# Patient Record
Sex: Female | Born: 1950 | Race: White | Hispanic: No | Marital: Married | Smoking: Former smoker
Health system: Southern US, Community
[De-identification: ages and names within clinical notes are randomized; demographics above are authoritative.]

## PROBLEM LIST (undated history)

## (undated) DIAGNOSIS — E785 Hyperlipidemia, unspecified: Secondary | ICD-10-CM

## (undated) DIAGNOSIS — R112 Nausea with vomiting, unspecified: Secondary | ICD-10-CM

## (undated) DIAGNOSIS — M1990 Unspecified osteoarthritis, unspecified site: Secondary | ICD-10-CM

## (undated) DIAGNOSIS — IMO0002 Reserved for concepts with insufficient information to code with codable children: Secondary | ICD-10-CM

## (undated) DIAGNOSIS — H9319 Tinnitus, unspecified ear: Secondary | ICD-10-CM

## (undated) DIAGNOSIS — D219 Benign neoplasm of connective and other soft tissue, unspecified: Secondary | ICD-10-CM

## (undated) DIAGNOSIS — Z9889 Other specified postprocedural states: Secondary | ICD-10-CM

## (undated) HISTORY — PX: LAPAROSCOPY FOR ECTOPIC PREGNANCY: SUR765

## (undated) HISTORY — DX: Tinnitus, unspecified ear: H93.19

## (undated) HISTORY — PX: TONSILLECTOMY AND ADENOIDECTOMY: SUR1326

## (undated) HISTORY — DX: Benign neoplasm of connective and other soft tissue, unspecified: D21.9

## (undated) HISTORY — DX: Reserved for concepts with insufficient information to code with codable children: IMO0002

## (undated) HISTORY — DX: Unspecified osteoarthritis, unspecified site: M19.90

## (undated) HISTORY — PX: COLONOSCOPY: SHX174

## (undated) HISTORY — DX: Hyperlipidemia, unspecified: E78.5

## (undated) HISTORY — PX: ABDOMINAL HYSTERECTOMY: SHX81

---

## 1971-07-06 HISTORY — PX: WISDOM TOOTH EXTRACTION: SHX21

## 1984-07-05 HISTORY — PX: LAPAROTOMY: SHX154

## 2002-07-05 HISTORY — PX: LITHOTRIPSY: SUR834

## 2003-05-09 ENCOUNTER — Ambulatory Visit (HOSPITAL_COMMUNITY): Admission: RE | Admit: 2003-05-09 | Discharge: 2003-05-09 | Payer: Self-pay | Admitting: Urology

## 2004-04-14 ENCOUNTER — Other Ambulatory Visit: Admission: RE | Admit: 2004-04-14 | Discharge: 2004-04-14 | Payer: Self-pay | Admitting: Obstetrics and Gynecology

## 2004-05-08 ENCOUNTER — Ambulatory Visit: Payer: Self-pay | Admitting: Internal Medicine

## 2005-11-23 ENCOUNTER — Other Ambulatory Visit: Admission: RE | Admit: 2005-11-23 | Discharge: 2005-11-23 | Payer: Self-pay | Admitting: Obstetrics and Gynecology

## 2006-12-15 ENCOUNTER — Other Ambulatory Visit: Admission: RE | Admit: 2006-12-15 | Discharge: 2006-12-15 | Payer: Self-pay | Admitting: Obstetrics and Gynecology

## 2007-12-18 ENCOUNTER — Other Ambulatory Visit: Admission: RE | Admit: 2007-12-18 | Discharge: 2007-12-18 | Payer: Self-pay | Admitting: Obstetrics & Gynecology

## 2008-07-05 DIAGNOSIS — IMO0002 Reserved for concepts with insufficient information to code with codable children: Secondary | ICD-10-CM

## 2008-07-05 HISTORY — DX: Reserved for concepts with insufficient information to code with codable children: IMO0002

## 2008-08-15 ENCOUNTER — Encounter: Admission: RE | Admit: 2008-08-15 | Discharge: 2008-08-15 | Payer: Self-pay | Admitting: Family Medicine

## 2008-09-19 ENCOUNTER — Encounter: Admission: RE | Admit: 2008-09-19 | Discharge: 2008-09-19 | Payer: Self-pay | Admitting: Neurosurgery

## 2008-10-07 ENCOUNTER — Encounter: Admission: RE | Admit: 2008-10-07 | Discharge: 2008-10-07 | Payer: Self-pay | Admitting: Neurosurgery

## 2012-06-21 ENCOUNTER — Telehealth: Payer: Self-pay | Admitting: Internal Medicine

## 2012-06-21 NOTE — Telephone Encounter (Signed)
Patient is calling because her younger sister has colon cancer. Her PCP wanted her to check with Dr. Juanda Chance to see if she needs to have a colonoscopy earlier than previously scheduled. Last colonoscopy 05/08/2004- Normal examination. Chart to MD desk for review. Please, advise.

## 2012-06-21 NOTE — Telephone Encounter (Signed)
Reviewed, last colon 05/08/2004.Mild diverticulosis. If there is a new hx of colon cancer in her sister then is ought to have a colonoscopy ASAP and stay on  5 year recall as a high risk patient. She may be a direct colon unless she wants to talk.

## 2012-06-22 NOTE — Telephone Encounter (Signed)
Left a message for patient to call me. 

## 2012-06-23 NOTE — Telephone Encounter (Signed)
Left a message for patient to call me. 

## 2012-06-26 ENCOUNTER — Encounter: Payer: Self-pay | Admitting: Internal Medicine

## 2012-06-26 NOTE — Telephone Encounter (Signed)
Patient given recommendations by Dr. Juanda Chance. She will check her husband's schedule and call back to schedule her direct colonoscopy.

## 2012-07-28 ENCOUNTER — Telehealth: Payer: Self-pay | Admitting: *Deleted

## 2012-07-28 NOTE — Telephone Encounter (Signed)
Pt. rescheduled previsit til 07/31/12 0800

## 2012-07-31 ENCOUNTER — Ambulatory Visit (AMBULATORY_SURGERY_CENTER): Payer: Federal, State, Local not specified - PPO | Admitting: *Deleted

## 2012-07-31 ENCOUNTER — Encounter: Payer: Self-pay | Admitting: Internal Medicine

## 2012-07-31 VITALS — Ht 67.0 in | Wt 205.0 lb

## 2012-07-31 DIAGNOSIS — Z1211 Encounter for screening for malignant neoplasm of colon: Secondary | ICD-10-CM

## 2012-07-31 MED ORDER — MOVIPREP 100 G PO SOLR: ORAL | Status: DC

## 2012-08-11 ENCOUNTER — Encounter: Payer: Self-pay | Admitting: Internal Medicine

## 2012-08-11 ENCOUNTER — Ambulatory Visit (AMBULATORY_SURGERY_CENTER): Payer: Federal, State, Local not specified - PPO | Admitting: Internal Medicine

## 2012-08-11 VITALS — BP 145/79 | HR 61 | Temp 96.9°F | Resp 20 | Ht 67.0 in | Wt 207.0 lb

## 2012-08-11 DIAGNOSIS — Z1211 Encounter for screening for malignant neoplasm of colon: Secondary | ICD-10-CM

## 2012-08-11 DIAGNOSIS — Z800 Family history of malignant neoplasm of digestive organs: Secondary | ICD-10-CM

## 2012-08-11 MED ORDER — SODIUM CHLORIDE 0.9 % IV SOLN: 500.0000 mL | INTRAVENOUS | Status: DC

## 2012-08-11 NOTE — Progress Notes (Signed)
Patient did not experience any of the following events: a burn prior to discharge; a fall within the facility; wrong site/side/patient/procedure/implant event; or a hospital transfer or hospital admission upon discharge from the facility. (G8907) Patient did not have preoperative order for IV antibiotic SSI prophylaxis. (G8918)  

## 2012-08-11 NOTE — Progress Notes (Signed)
Pt stable to PACU 

## 2012-08-11 NOTE — Op Note (Signed)
[facility] [address] [place], [address]   COLONOSCOPY PROCEDURE REPORT  PATIENT: April Sawyer, April Sawyer  MR#: [id number] BIRTHDATE: [birth year] , 61  yrs. old GENDER: Female ENDOSCOPIST: Hart Carwin, MD REFERRED BY:  Elby Showers, M.D. PROCEDURE DATE:  08/11/2012 PROCEDURE:   Colonoscopy, screening ASA CLASS:   Class II INDICATIONS:Patient's immediate family history of colon cancer and last colon 2005, sister with colon cancer. MEDICATIONS: MAC sedation, administered by CRNA and propofol (Diprivan) 250mg  IV  DESCRIPTION OF PROCEDURE:   After the risks and benefits and of the procedure were explained, informed consent was obtained.  A digital rectal exam revealed no abnormalities of the rectum.    The LB [id number]  endoscope was introduced through the anus and advanced to the cecum, which was identified by both the appendix and ileocecal valve .  The quality of the prep was excellent, using MoviPrep .  The instrument was then slowly withdrawn as the colon was fully examined.     COLON FINDINGS: Mild diverticulosis was noted.     Retroflexed views revealed no abnormalities.     The scope was then withdrawn from the patient and the procedure completed.  COMPLICATIONS: There were no complications. ENDOSCOPIC IMPRESSION: Mild diverticulosis was noted  RECOMMENDATIONS: High fiber diet   REPEAT EXAM: In 5 year(s)  for Colonoscopy.  cc:  _______________________________ eSignedHart Carwin, MD 08/11/2012 1:56 PM

## 2012-08-11 NOTE — Patient Instructions (Addendum)

## 2012-08-14 ENCOUNTER — Telehealth: Payer: Self-pay | Admitting: *Deleted

## 2012-08-14 NOTE — Telephone Encounter (Signed)
  Follow up Call-  Call back number 08/11/2012  Post procedure Call Back phone  # [phone]  Permission to leave phone message Yes     Patient questions:  Do you have a fever, pain , or abdominal swelling? no Pain Score  0 *  Have you tolerated food without any problems? yes  Have you been able to return to your normal activities? yes  Do you have any questions about your discharge instructions: Diet   no Medications  no Follow up visit  no  Do you have questions or concerns about your Care? no  Actions: * If pain score is 4 or above: No action needed, pain <4.

## 2012-08-23 NOTE — Addendum Note (Signed)
Addended by: Maple Hudson on: 08/23/2012 09:54 AM   Modules accepted: Level of Service

## 2012-08-28 ENCOUNTER — Other Ambulatory Visit: Payer: Self-pay | Admitting: Neurosurgery

## 2012-08-28 DIAGNOSIS — IMO0002 Reserved for concepts with insufficient information to code with codable children: Secondary | ICD-10-CM

## 2012-09-05 ENCOUNTER — Other Ambulatory Visit: Payer: Self-pay | Admitting: Neurosurgery

## 2012-09-05 ENCOUNTER — Ambulatory Visit
Admission: RE | Admit: 2012-09-05 | Discharge: 2012-09-05 | Disposition: A | Discharge status: Home / Self Care | Payer: Federal, State, Local not specified - PPO | Source: Ambulatory Visit | Attending: Neurosurgery | Admitting: Neurosurgery

## 2012-09-05 DIAGNOSIS — M5126 Other intervertebral disc displacement, lumbar region: Secondary | ICD-10-CM

## 2012-09-05 DIAGNOSIS — M48061 Spinal stenosis, lumbar region without neurogenic claudication: Secondary | ICD-10-CM

## 2012-09-05 DIAGNOSIS — IMO0002 Reserved for concepts with insufficient information to code with codable children: Secondary | ICD-10-CM

## 2012-10-12 ENCOUNTER — Other Ambulatory Visit: Payer: Self-pay | Admitting: Neurosurgery

## 2012-10-12 DIAGNOSIS — M549 Dorsalgia, unspecified: Secondary | ICD-10-CM

## 2012-10-16 ENCOUNTER — Ambulatory Visit
Admission: RE | Admit: 2012-10-16 | Discharge: 2012-10-16 | Disposition: A | Discharge status: Home / Self Care | Payer: Federal, State, Local not specified - PPO | Source: Ambulatory Visit | Attending: Neurosurgery | Admitting: Neurosurgery

## 2012-10-16 DIAGNOSIS — M549 Dorsalgia, unspecified: Secondary | ICD-10-CM

## 2012-10-16 MED ORDER — METHYLPREDNISOLONE ACETATE 40 MG/ML INJ SUSP (RADIOLOG
120.0000 mg | Freq: Once | INTRAMUSCULAR | Status: AC
  Administered 2012-10-16: 120 mg via EPIDURAL

## 2012-10-16 MED ORDER — IOHEXOL 180 MG/ML  SOLN
1.0000 mL | Freq: Once | INTRAMUSCULAR | Status: AC | PRN
  Administered 2012-10-16: 1 mL via EPIDURAL

## 2013-02-02 ENCOUNTER — Encounter: Payer: Self-pay | Admitting: Obstetrics and Gynecology

## 2013-02-02 ENCOUNTER — Ambulatory Visit: Payer: Self-pay | Admitting: Obstetrics and Gynecology

## 2013-02-02 DIAGNOSIS — Z01419 Encounter for gynecological examination (general) (routine) without abnormal findings: Secondary | ICD-10-CM

## 2013-08-27 ENCOUNTER — Ambulatory Visit (INDEPENDENT_AMBULATORY_CARE_PROVIDER_SITE_OTHER): Payer: Federal, State, Local not specified - PPO | Admitting: Obstetrics & Gynecology

## 2013-08-27 ENCOUNTER — Encounter: Payer: Self-pay | Admitting: Obstetrics & Gynecology

## 2013-08-27 VITALS — BP 140/78 | HR 64 | Resp 16 | Ht 65.5 in | Wt 206.5 lb

## 2013-08-27 DIAGNOSIS — Z8041 Family history of malignant neoplasm of ovary: Secondary | ICD-10-CM

## 2013-08-27 DIAGNOSIS — Z124 Encounter for screening for malignant neoplasm of cervix: Secondary | ICD-10-CM

## 2013-08-27 DIAGNOSIS — Z0000 Encounter for general adult medical examination without abnormal findings: Secondary | ICD-10-CM

## 2013-08-27 DIAGNOSIS — Z01419 Encounter for gynecological examination (general) (routine) without abnormal findings: Secondary | ICD-10-CM

## 2013-08-27 DIAGNOSIS — D219 Benign neoplasm of connective and other soft tissue, unspecified: Secondary | ICD-10-CM

## 2013-08-27 DIAGNOSIS — D259 Leiomyoma of uterus, unspecified: Secondary | ICD-10-CM

## 2013-08-27 DIAGNOSIS — R899 Unspecified abnormal finding in specimens from other organs, systems and tissues: Secondary | ICD-10-CM

## 2013-08-27 DIAGNOSIS — R6889 Other general symptoms and signs: Secondary | ICD-10-CM

## 2013-08-27 LAB — POCT URINALYSIS DIPSTICK
Bilirubin, UA: NEGATIVE
Blood, UA: NEGATIVE
GLUCOSE UA: NEGATIVE
Ketones, UA: NEGATIVE
NITRITE UA: NEGATIVE
Protein, UA: NEGATIVE
UROBILINOGEN UA: NEGATIVE
pH, UA: 5

## 2013-08-27 LAB — HEMOGLOBIN A1C
Hgb A1c MFr Bld: 5.8 % — ABNORMAL HIGH (ref <5.7)
Mean Plasma Glucose: 120 mg/dL — ABNORMAL HIGH (ref <117)

## 2013-08-27 MED ORDER — SOLIFENACIN SUCCINATE 5 MG PO TABS: 5.0000 mg | ORAL_TABLET | Freq: Every day | ORAL | Status: DC

## 2013-08-27 NOTE — Progress Notes (Signed)
63 y.o. [id number] MarriedCaucasianF here for annual exam.  No vaginal bleeding.  Notices that she is getting up a lot more at night to urinate.  Feels urgency as well.  Tried oxytrol OTC patches and but she didn't like the patch stickiness.    Patient's last menstrual period was 12/03/2008.          Sexually active: yes  The current method of family planning is none.    Exercising: yes  water aerobics Smoker:  Former smoker in [place] Maintenance: Pap:  01/26/11 WNL History of abnormal Pap:  no MMG:  06/04/13-normal Colonoscopy:  08/11/12 repeat in 5 years BMD:   4/10 normal TDaP:  8/07 Screening Labs: work (copies scanned into EP[employer]), Hb today: 14.0, Urine today: WBC-2+   reports that she has quit smoking. She has never used smokeless tobacco. She reports that she drinks about 3.0 ounces of alcohol per week. She reports that she does not use illicit drugs.  Past Medical History  Diagnosis Date  . Arthritis   . Hyperlipidemia   . Fibroid 8/08    11x6x5 cm  . 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 Hyperlipidemia   . Tinnitus   . Bulging disc 2010    L3-L5    Past Surgical History  Procedure Laterality Date  . Ectopic pregnancy surgery  1986  . Laparoscopy for ectopic pregnancy  1986-1992    x three  . Lithotripsy  2004  . Salpingectomy Left 1986    for ectopic; 2 ectopics R tube  . Tonsillectomy and adenoidectomy      Current Outpatient Prescriptions  Medication Sig Dispense Refill  . aspirin 81 MG tablet Take 81 mg by mouth daily.      Marland Kitchen atorvastatin (LIPITOR) 10 MG tablet Take 10 mg by mouth daily.      . Calcium Carbonate-Vitamin D (CALCIUM + D PO) Take by mouth daily.      . IBUPROFEN PO Take by mouth as needed.      . Multiple Vitamins-Minerals (MULTIVITAMIN WITH MINERALS) tablet Take 1 tablet by mouth daily.      . psyllium (REGULOID) 0.52 G capsule Take 4 capsules by mouth daily.       No current facility-administered medications for this visit.    Family History   Problem Relation Age of Onset  . Colon cancer Sister   . Breast cancer Sister   . Ovarian cancer Mother 46    ROS:  Pertinent items are noted in HPI.  Otherwise, a comprehensive ROS was negative.  Exam:   BP 140/78  Pulse 64  Resp 16  Ht 5' 5.5" (1.664 m)  Wt 206 lb 8 oz (93.668 kg)  BMI 33.83 kg/m2  LMP 12/03/2008  Weight change: -2lb  Height: 5' 5.5" (166.4 cm)  Ht Readings from Last 3 Encounters:  08/27/13 5' 5.5" (1.664 m)  08/11/12 5\' 7"  (1.702 m)  07/31/12 5\' 7"  (1.702 m)    General appearance: alert, cooperative and appears stated age Head: Normocephalic, without obvious abnormality, atraumatic Neck: no adenopathy, supple, symmetrical, trachea midline and thyroid normal to inspection and palpation Lungs: clear to auscultation bilaterally Breasts: normal appearance, no masses or tenderness Heart: regular rate and rhythm Abdomen: soft, non-tender; bowel sounds normal; no masses,  no organomegaly Extremities: extremities normal, atraumatic, no cyanosis or edema Skin: Skin color, texture, turgor normal. No rashes or lesions Lymph nodes: Cervical, supraclavicular, and axillary nodes normal. No abnormal inguinal nodes palpated Neurologic: Grossly  normal   Pelvic: External genitalia:  no lesions              Urethra:  normal appearing urethra with no masses, tenderness or lesions              Bartholins and Skenes: normal                 Vagina: normal appearing vagina with normal color and discharge, no lesions              Cervix: no lesions              Pap taken: yes Bimanual Exam:  Uterus:  normal size, contour, position, consistency, mobility, non-tender              Adnexa: normal adnexa and no mass, fullness, tenderness               Rectovaginal: Confirms               Anus:  normal sphincter tone, no lesions  A:  Well Woman with normal exam PMP, no HRT H/O uterine fibroid OAB Mildly elevated glucose on screening labs at work Family hx ovarian cancer in  mother Family hx of breast cancer and colon cancer in sister  P:   Mammogram yearly.  D/W 3D MMG pap smear with HR HPV today Trial of vesicare 5mg  daily.  Rx to pharmacy.  Dry eyes, dry mouth, constipation side effects discussed. Ca-125 today.  PUS Aug--2 years from last one. HbA1C pending. BMD with MMG in 12/15.  Order sent to [facility]. return annually or prn  An After Visit Summary was printed and given to the patient.

## 2013-08-27 NOTE — Patient Instructions (Signed)

## 2013-08-28 LAB — HEMOGLOBIN, FINGERSTICK: HEMOGLOBIN, FINGERSTICK: 14 g/dL (ref 12.0–16.0)

## 2013-08-28 LAB — CA 125: CA 125: 5.9 U/mL (ref 0.0–30.2)

## 2013-08-29 ENCOUNTER — Telehealth: Payer: Self-pay | Admitting: Obstetrics & Gynecology

## 2013-08-29 LAB — IPS PAP TEST WITH HPV

## 2013-08-29 NOTE — Telephone Encounter (Signed)
Left message for patient to call back. Need to go over insurance info and schedule pus.

## 2013-09-05 NOTE — Telephone Encounter (Signed)
Voicemail confirmed patient identity. Left message for patient to call back. Need to go over insurance info and schedule pus. Advised that PR for PUS is a $40 copay.

## 2013-09-14 NOTE — Telephone Encounter (Signed)
Spoke with patient. Advised of $40 copay for pus. Scheduled pus. Advised of cancellation policy and cancellation fee. Patient agreeable. Edd Fabian

## 2013-09-27 ENCOUNTER — Ambulatory Visit (INDEPENDENT_AMBULATORY_CARE_PROVIDER_SITE_OTHER): Payer: Federal, State, Local not specified - PPO

## 2013-09-27 ENCOUNTER — Ambulatory Visit (INDEPENDENT_AMBULATORY_CARE_PROVIDER_SITE_OTHER): Payer: Federal, State, Local not specified - PPO | Admitting: Obstetrics & Gynecology

## 2013-09-27 ENCOUNTER — Ambulatory Visit: Payer: Federal, State, Local not specified - PPO

## 2013-09-27 ENCOUNTER — Other Ambulatory Visit: Payer: Self-pay | Admitting: *Deleted

## 2013-09-27 ENCOUNTER — Other Ambulatory Visit: Payer: Self-pay | Admitting: Obstetrics & Gynecology

## 2013-09-27 VITALS — BP 134/72 | Ht 65.5 in | Wt 209.0 lb

## 2013-09-27 DIAGNOSIS — D219 Benign neoplasm of connective and other soft tissue, unspecified: Secondary | ICD-10-CM

## 2013-09-27 DIAGNOSIS — D259 Leiomyoma of uterus, unspecified: Secondary | ICD-10-CM

## 2013-09-27 DIAGNOSIS — Z8041 Family history of malignant neoplasm of ovary: Secondary | ICD-10-CM

## 2013-09-27 DIAGNOSIS — R9389 Abnormal findings on diagnostic imaging of other specified body structures: Secondary | ICD-10-CM

## 2013-09-27 NOTE — Progress Notes (Signed)
63 y.o.Marriedfemale here for a pelvic ultrasound due to uterine fibroids and family hx of ovarian cancer.  Pt has had multiple ultrasounds over the last years and endometrial biopsies due do thickened appearing endometrium.  She has not had any bleeding since going into menopause several years ago.  Wants to discuss hysterectomy today, if possible.  Retiring next year and just ready to be done.  Patient's last menstrual period was 12/03/2008.  Sexually active:  yes  Contraception: no method  Technique:  Both transabdominal and transvaginal ultrasound examinations of the pelvis were performed. Transabdominal technique was performed for global imaging of the pelvis including uterus,  ovaries, adnexal regions, and pelvic cul-de-sac.  It was necessary to proceed with endovaginal exam following the abdominal ultrasound transabdominal exam to visualize the endometrium and adnexa.  Color and duplex Doppler ultrasound was utilized to evaluate blood flow to the ovaries.   FINDINGS: UTERUS: 7.1 x 6.6 x 4.5cm with 4.4cm fibroid (slightly increased in size from 3.8cm at last u/s) EMS: 7.8 - 9.20mm, bright in appearance on u/s, posterior wall appears thickened ADNEXA:   Left ovary 2.3 x 1.5 x 1.2cm   Right ovary 1.5 x 1.3 x 0.8cm CUL DE SAC:  No free fluid in PCDS  SHSG:  After obtaining appropriate verbal consent from patient, the cervix was visualized using a speculum, and prepped with betadine.  A tenaculum  was applied to the cervix.  Dilation of the cervix was not necessary. The catheter was passed into the uterus and sterile saline introduced, with the following findings: posterior filling defect 71mm  Images reviewed with patient and findings discussed.  Now with a possible endometrial polyp and need to consider hysteroscopy, she is really ready to be done and proceed with hysterectomy.  Daughter graduating from college in May so will want to do this later.  I am fine with that.  H/x of multiple ectopic  pregnancies but never had one rupture.  Has had several laparoscopies that went well, per patient.  Procedure discussed, risks and benefits reviewed.  Timing, hospital stay all discussed.  Pt ready to schedule  Assessment:  Endometrial thickening, uterine fibroids, family hx of ovarian cancer in her mother Plan: Plan robotic assisted TLH/BSO.  Also plan pre-op endometrial biopsy.  ~30 minutes spent with patient >50% of time was in face to face discussion of above.

## 2013-09-28 ENCOUNTER — Encounter: Payer: Self-pay | Admitting: Obstetrics & Gynecology

## 2013-09-28 DIAGNOSIS — D219 Benign neoplasm of connective and other soft tissue, unspecified: Secondary | ICD-10-CM | POA: Insufficient documentation

## 2013-09-28 DIAGNOSIS — R9389 Abnormal findings on diagnostic imaging of other specified body structures: Secondary | ICD-10-CM | POA: Insufficient documentation

## 2013-09-28 DIAGNOSIS — Z8041 Family history of malignant neoplasm of ovary: Secondary | ICD-10-CM | POA: Insufficient documentation

## 2013-09-28 NOTE — Patient Instructions (Signed)
Gay Filler will call you with information regarding your surgery.

## 2013-10-02 ENCOUNTER — Telehealth: Payer: Self-pay | Admitting: Obstetrics & Gynecology

## 2013-10-02 NOTE — Telephone Encounter (Signed)
Left message for patient to call back. Need to go over surgery benefits. °

## 2013-10-09 NOTE — Telephone Encounter (Signed)
Spoke with patient, advised that per insurance quote received she will be responsible for a $200 copay for surgeons fees. Advised that payment is due 2 weeks prior to surgery date. Advised that nurse would contact her to schedule surgery.

## 2013-10-18 ENCOUNTER — Telehealth: Payer: Self-pay | Admitting: Orthopedic Surgery

## 2013-10-18 NOTE — Telephone Encounter (Signed)
Spoke with pt about pre-op instructions, and scheduling pre-op and postop appts. Mailed instructions to pt. Pt agreeable.

## 2013-10-26 ENCOUNTER — Other Ambulatory Visit: Payer: Self-pay | Admitting: *Deleted

## 2013-10-26 DIAGNOSIS — R9389 Abnormal findings on diagnostic imaging of other specified body structures: Secondary | ICD-10-CM

## 2013-10-30 ENCOUNTER — Telehealth: Payer: Self-pay | Admitting: Obstetrics & Gynecology

## 2013-10-30 NOTE — Telephone Encounter (Signed)
Left message for patient to call back. Need to inform patient of $150 copay for Endo Bx scheduled 06.22.2015.

## 2013-12-21 ENCOUNTER — Telehealth: Payer: Self-pay | Admitting: Obstetrics and Gynecology

## 2013-12-21 NOTE — Telephone Encounter (Signed)
December 21, 2013   Dear April Sawyer,   Your surgery is scheduled for January 07, 2014.  Upon requesting authorization for your surgery, your insurance company has informed us that they will cover 100% of the charges after a $200 copay, and you will be responsible to pay approximately $200.  It is our office policy that this amount is paid in full two weeks prior to your surgery. Your payment is due on December 24, 2013.  If there is a balance due after your insurance company pays their portion, we will send you a bill.  If there is a refund due to you, we will send you a check within one month.  Payment may be made by cash, check, Visa, Nurse, learning disability. Payment can be made in the office or over the telephone.  If payment is not made two weeks prior to your surgery, we will have to reschedule your surgery.  The above fee includes only our fee for the surgery and does not include charges you may have from the facility, anesthesia or pathology.  If you have any questions, please call us at [phone].

## 2013-12-24 ENCOUNTER — Ambulatory Visit (INDEPENDENT_AMBULATORY_CARE_PROVIDER_SITE_OTHER): Payer: Federal, State, Local not specified - PPO | Admitting: Obstetrics & Gynecology

## 2013-12-24 ENCOUNTER — Encounter: Payer: Self-pay | Admitting: Obstetrics & Gynecology

## 2013-12-24 VITALS — BP 130/78 | HR 78 | Resp 20 | Ht 65.5 in | Wt 212.0 lb

## 2013-12-24 DIAGNOSIS — D251 Intramural leiomyoma of uterus: Secondary | ICD-10-CM

## 2013-12-24 DIAGNOSIS — Z8041 Family history of malignant neoplasm of ovary: Secondary | ICD-10-CM

## 2013-12-24 DIAGNOSIS — N840 Polyp of corpus uteri: Secondary | ICD-10-CM

## 2013-12-24 DIAGNOSIS — R9389 Abnormal findings on diagnostic imaging of other specified body structures: Secondary | ICD-10-CM

## 2013-12-24 MED ORDER — MORPHINE SULFATE 15 MG PO TABS: 15.0000 mg | ORAL_TABLET | ORAL | Status: DC | PRN

## 2013-12-24 MED ORDER — IBUPROFEN 800 MG PO TABS: 800.0000 mg | ORAL_TABLET | Freq: Three times a day (TID) | ORAL | Status: DC | PRN

## 2013-12-24 NOTE — Progress Notes (Signed)
Patient ID: April Sawyer, female   DOB: Aug 20, 1950, 63 y.o.   MRN: [id number]  62 y.o. [id number] MarriedCaucasian female here for discussion of upcoming procedure.  Robotic TLH/BSO planned due to uterine fibroids, increasing in size.  Pre-op evaluation thus far has included ultrasound done 09/27/13 with uterus measuring 7 x 6.6 x 4.5 with 4.4cm fibroid (increased from 3.8cm in size).  Endometrium was thickened measuring between 7-6mm.  SHGM was performed showing a 1cm endometrial polyp.  She has had no bleeding.    Pt's hx is complicated by four prior ectopic pregnancies.  One of these was treated with laparotomy and three with laparoscopy.  Ultimately one of her tubes was removed.  She is unsure which one.  She was able to have 2 vaginal deliveries.    Procedure discussed with patient.  Hospital stay, recovery and pain management all discussed.  Risks discussed including but not limited to bleeding, 1% risk of receiving a  transfusion, infection, 3-4% risk of bowel/bladder/ureteral/vascular injury discussed as well as possible need for additional surgery if injury does occur discussed.  DVT/PE and rare risk of death discussed.  My actual complications with prior surgeries discussed.  Vaginal cuff dehiscence discussed.  Hernia formation discussed.  Positioning and incision locations discussed.  Patient aware if pathology abnormal she may need additional treatment.  All questions answered.    Ob Hx:   Patient's last menstrual period was 12/03/2008.          Sexually active: yes Birth control: PMP state Last pap: Last MMG: Tobacco:  Past Surgical History  Procedure Laterality Date  . Laparoscopy for ectopic pregnancy  1986-1992    x three, salpingectomy  . Lithotripsy  2004  . Tonsillectomy and adenoidectomy      Past Medical History  Diagnosis Date  . Arthritis   . Hyperlipidemia   . Fibroid      8/12-3.8cm  . 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 Hyperlipidemia   . Tinnitus   . Bulging disc  2010    L3-L5    Allergies: Influenza vaccines; Percodan; and Thimerosal  Current Outpatient Prescriptions  Medication Sig Dispense Refill  . atorvastatin (LIPITOR) 10 MG tablet Take 10 mg by mouth daily.      . Multiple Vitamins-Minerals (MULTIVITAMIN WITH MINERALS) tablet Take 1 tablet by mouth daily.      . psyllium (REGULOID) 0.52 G capsule Take 4 capsules by mouth daily.      . solifenacin (VESICARE) 5 MG tablet Take 1 tablet (5 mg total) by mouth daily. One po qd  30 tablet  12  . aspirin 81 MG tablet Take 81 mg by mouth daily.      . Calcium Carbonate-Vitamin D (CALCIUM + D PO) Take by mouth daily.      . IBUPROFEN PO Take by mouth as needed.       No current facility-administered medications for this visit.    ROS: A comprehensive review of systems was negative.  Exam:    BP 130/78  Pulse 78  Resp 20  Ht 5' 5.5" (1.664 m)  Wt 212 lb (96.163 kg)  BMI 34.73 kg/m2  LMP 12/03/2008  General appearance: alert and cooperative Head: Normocephalic, without obvious abnormality, atraumatic Neck: no adenopathy, supple, symmetrical, trachea midline and thyroid not enlarged, symmetric, no tenderness/mass/nodules Lungs: clear to auscultation bilaterally Heart: regular rate and rhythm, S1, S2 normal, no murmur, click, rub or gallop Abdomen: soft, non-tender; bowel sounds normal; no masses,  no organomegaly Extremities: extremities normal, atraumatic, no cyanosis or edema Skin: Skin color, texture, turgor normal. No rashes or lesions Lymph nodes: Cervical, supraclavicular, and axillary nodes normal. no inguinal nodes palpated Neurologic: Grossly normal  Pelvic: External genitalia:  no lesions              Urethra: normal appearing urethra with no masses, tenderness or lesions              Bartholins and Skenes: normal                 Vagina: normal appearing vagina with normal color and discharge, no lesions, atrophic              Cervix: normal appearance              Pap  taken: no        Bimanual Exam:  Uterus:  uterus is normal size, shape, consistency and nontender                                      Adnexa:    normal adnexa in size, nontender and no masses                                      Rectovaginal: Deferred                                      Anus:  normal sphincter tone, no lesions  Endometrial biopsy recommended.  Discussed with patient.  Verbal and written consent obtained.    Procedure:  Speculum placed.  Cervix visualized and cleansed with betadine prep.  A single toothed tenaculum was applied to the anterior lip of the cervix.  Endometrial pipelle was advanced through the cervix into the endometrial cavity without difficulty.  Pipelle passed to 7.5cm.  Suction applied and pipelle removed with good tissue sample obtained.  Two passes were performed.  Tenculum removed.  No bleeding noted.  Patient tolerated procedure well.  A: Uterine Fibroids, probable endometrial polyp, family hx of ovarian cancer (mother age 39)  P:  Robotic assisted TLH/BSO/cystoscopy planned Rx for Motrin and MSIR 15mg  every 4 hrs prn given. Medications/Vitamins reviewed.  Pt knows needs to stop ASA 10 days before surgery. Hysterectomy brochure given for pre and post op instructions. Endometrial biopsy result is pending.  ~30 minutes spent with patient >50% of time was in face to face discussion of above.

## 2013-12-24 NOTE — Patient Instructions (Signed)
Hysterectomy brochure given.

## 2014-01-02 ENCOUNTER — Encounter (HOSPITAL_COMMUNITY)
Admission: RE | Admit: 2014-01-02 | Discharge: 2014-01-02 | Disposition: A | Discharge status: Home / Self Care | Payer: Federal, State, Local not specified - PPO | Source: Ambulatory Visit | Attending: Obstetrics & Gynecology | Admitting: Obstetrics & Gynecology

## 2014-01-02 ENCOUNTER — Encounter (HOSPITAL_COMMUNITY): Payer: Self-pay

## 2014-01-02 DIAGNOSIS — Z01812 Encounter for preprocedural laboratory examination: Secondary | ICD-10-CM | POA: Insufficient documentation

## 2014-01-02 HISTORY — DX: Other specified postprocedural states: Z98.890

## 2014-01-02 HISTORY — DX: Nausea with vomiting, unspecified: R11.2

## 2014-01-02 LAB — CBC
HEMATOCRIT: 42.4 % (ref 36.0–46.0)
Hemoglobin: 14.1 g/dL (ref 12.0–15.0)
MCH: 32.6 pg (ref 26.0–34.0)
MCHC: 33.3 g/dL (ref 30.0–36.0)
MCV: 97.9 fL (ref 78.0–100.0)
Platelets: 193 10*3/uL (ref 150–400)
RBC: 4.33 MIL/uL (ref 3.87–5.11)
RDW: 12.6 % (ref 11.5–15.5)
WBC: 4.7 10*3/uL (ref 4.0–10.5)

## 2014-01-02 NOTE — Patient Instructions (Addendum)
   Your procedure is scheduled on:  Monday July 6  Enter through the [facility] at: 6 AM Pick up the phone at the desk and dial [phone] and inform us of your arrival.  Please call this number if you have any problems the morning of surgery: [phone]  Remember: Do not eat or drink after midnight: Sunday Take these medicines the morning of surgery with a SIP OF WATER:  None  Do not wear jewelry, make-up, or FINGER nail polish No metal in your hair or on your body. Do not wear lotions, powders, perfumes.  You may wear deodorant.  Do not bring valuables to the hospital. Contacts, dentures or bridgework may not be worn into surgery.  Leave suitcase in the car. After Surgery it may be brought to your room. For patients being admitted to the hospital, checkout time is 11:00am the day of discharge.  Home with husband Richardson Landry cell [phone].

## 2014-01-06 MED ORDER — DEXTROSE 5 % IV SOLN
2.0000 g | INTRAVENOUS | Status: AC
  Administered 2014-01-07: 2 g via INTRAVENOUS
  Filled 2014-01-06: qty 2

## 2014-01-07 ENCOUNTER — Ambulatory Visit (HOSPITAL_COMMUNITY): Payer: Federal, State, Local not specified - PPO | Admitting: Anesthesiology

## 2014-01-07 ENCOUNTER — Encounter (HOSPITAL_COMMUNITY): Payer: Self-pay | Admitting: Anesthesiology

## 2014-01-07 ENCOUNTER — Encounter (HOSPITAL_COMMUNITY): Payer: Federal, State, Local not specified - PPO | Admitting: Anesthesiology

## 2014-01-07 ENCOUNTER — Encounter (HOSPITAL_COMMUNITY)
Admission: RE | Disposition: A | Discharge status: Home / Self Care | Payer: Self-pay | Source: Ambulatory Visit | Attending: Obstetrics & Gynecology

## 2014-01-07 ENCOUNTER — Ambulatory Visit (HOSPITAL_COMMUNITY)
Admission: RE | Admit: 2014-01-07 | Discharge: 2014-01-08 | Disposition: A | Discharge status: Home / Self Care | Payer: Federal, State, Local not specified - PPO | Source: Ambulatory Visit | Attending: Obstetrics & Gynecology | Admitting: Obstetrics & Gynecology

## 2014-01-07 DIAGNOSIS — E785 Hyperlipidemia, unspecified: Secondary | ICD-10-CM | POA: Insufficient documentation

## 2014-01-07 DIAGNOSIS — N840 Polyp of corpus uteri: Secondary | ICD-10-CM | POA: Insufficient documentation

## 2014-01-07 DIAGNOSIS — R9389 Abnormal findings on diagnostic imaging of other specified body structures: Secondary | ICD-10-CM | POA: Insufficient documentation

## 2014-01-07 DIAGNOSIS — D251 Intramural leiomyoma of uterus: Secondary | ICD-10-CM

## 2014-01-07 DIAGNOSIS — N9489 Other specified conditions associated with female genital organs and menstrual cycle: Secondary | ICD-10-CM | POA: Insufficient documentation

## 2014-01-07 DIAGNOSIS — N838 Other noninflammatory disorders of ovary, fallopian tube and broad ligament: Secondary | ICD-10-CM | POA: Insufficient documentation

## 2014-01-07 DIAGNOSIS — D219 Benign neoplasm of connective and other soft tissue, unspecified: Secondary | ICD-10-CM | POA: Diagnosis present

## 2014-01-07 DIAGNOSIS — Z800 Family history of malignant neoplasm of digestive organs: Secondary | ICD-10-CM | POA: Insufficient documentation

## 2014-01-07 DIAGNOSIS — N72 Inflammatory disease of cervix uteri: Secondary | ICD-10-CM | POA: Insufficient documentation

## 2014-01-07 DIAGNOSIS — Z8041 Family history of malignant neoplasm of ovary: Secondary | ICD-10-CM | POA: Insufficient documentation

## 2014-01-07 DIAGNOSIS — N736 Female pelvic peritoneal adhesions (postinfective): Secondary | ICD-10-CM

## 2014-01-07 DIAGNOSIS — Z87891 Personal history of nicotine dependence: Secondary | ICD-10-CM | POA: Insufficient documentation

## 2014-01-07 HISTORY — PX: ROBOTIC ASSISTED TOTAL HYSTERECTOMY WITH BILATERAL SALPINGO OOPHERECTOMY: SHX6086

## 2014-01-07 HISTORY — PX: CYSTOSCOPY: SHX5120

## 2014-01-07 SURGERY — ROBOTIC ASSISTED TOTAL HYSTERECTOMY WITH BILATERAL SALPINGO OOPHORECTOMY
Anesthesia: General | Site: Urethra

## 2014-01-07 MED ORDER — SODIUM CHLORIDE 0.9 % IJ SOLN
INTRAMUSCULAR | Status: AC
Start: 2014-01-07 — End: 2014-01-07
  Filled 2014-01-07: qty 50

## 2014-01-07 MED ORDER — FENTANYL CITRATE 0.05 MG/ML IJ SOLN: 25.0000 ug | INTRAMUSCULAR | Status: DC | PRN

## 2014-01-07 MED ORDER — DEXAMETHASONE SODIUM PHOSPHATE 10 MG/ML IJ SOLN
INTRAMUSCULAR | Status: AC
  Filled 2014-01-07: qty 1

## 2014-01-07 MED ORDER — LACTATED RINGERS IV SOLN
INTRAVENOUS | Status: DC
  Administered 2014-01-07 (×2): via INTRAVENOUS

## 2014-01-07 MED ORDER — STERILE WATER FOR IRRIGATION IR SOLN
Status: DC | PRN
  Administered 2014-01-07: 1000 mL

## 2014-01-07 MED ORDER — METOCLOPRAMIDE HCL 5 MG/ML IJ SOLN: 10.0000 mg | Freq: Once | INTRAMUSCULAR | Status: DC | PRN

## 2014-01-07 MED ORDER — ACETAMINOPHEN 325 MG PO TABS: 650.0000 mg | ORAL_TABLET | ORAL | Status: DC | PRN

## 2014-01-07 MED ORDER — PROPOFOL 10 MG/ML IV BOLUS
INTRAVENOUS | Status: DC | PRN
  Administered 2014-01-07: 150 mg via INTRAVENOUS

## 2014-01-07 MED ORDER — LACTATED RINGERS IR SOLN
Status: DC | PRN
  Administered 2014-01-07: 3000 mL

## 2014-01-07 MED ORDER — KETOROLAC TROMETHAMINE 30 MG/ML IJ SOLN
15.0000 mg | Freq: Once | INTRAMUSCULAR | Status: AC | PRN
  Administered 2014-01-07: 30 mg via INTRAVENOUS

## 2014-01-07 MED ORDER — KETOROLAC TROMETHAMINE 30 MG/ML IJ SOLN: 30.0000 mg | Freq: Four times a day (QID) | INTRAMUSCULAR | Status: DC

## 2014-01-07 MED ORDER — FENTANYL CITRATE 0.05 MG/ML IJ SOLN
INTRAMUSCULAR | Status: DC | PRN
  Administered 2014-01-07 (×5): 50 ug via INTRAVENOUS

## 2014-01-07 MED ORDER — ALUM & MAG HYDROXIDE-SIMETH 200-200-20 MG/5ML PO SUSP: 30.0000 mL | ORAL | Status: DC | PRN

## 2014-01-07 MED ORDER — SIMETHICONE 80 MG PO CHEW: 80.0000 mg | CHEWABLE_TABLET | Freq: Four times a day (QID) | ORAL | Status: DC | PRN

## 2014-01-07 MED ORDER — KETOROLAC TROMETHAMINE 30 MG/ML IJ SOLN
INTRAMUSCULAR | Status: AC
  Filled 2014-01-07: qty 1

## 2014-01-07 MED ORDER — ONDANSETRON HCL 4 MG/2ML IJ SOLN
INTRAMUSCULAR | Status: AC
  Filled 2014-01-07: qty 2

## 2014-01-07 MED ORDER — MORPHINE SULFATE 15 MG PO TABS: 15.0000 mg | ORAL_TABLET | ORAL | Status: DC | PRN

## 2014-01-07 MED ORDER — KETOROLAC TROMETHAMINE 30 MG/ML IJ SOLN
30.0000 mg | Freq: Four times a day (QID) | INTRAMUSCULAR | Status: DC
  Administered 2014-01-07 (×2): 30 mg via INTRAVENOUS
  Filled 2014-01-07 (×2): qty 1

## 2014-01-07 MED ORDER — SODIUM CHLORIDE 0.9 % IJ SOLN
INTRAMUSCULAR | Status: AC
  Filled 2014-01-07: qty 10

## 2014-01-07 MED ORDER — ROPIVACAINE HCL 5 MG/ML IJ SOLN
INTRAMUSCULAR | Status: AC
  Filled 2014-01-07: qty 60

## 2014-01-07 MED ORDER — MENTHOL 3 MG MT LOZG
1.0000 | LOZENGE | OROMUCOSAL | Status: DC | PRN
Start: 2014-01-07 — End: 2014-01-08

## 2014-01-07 MED ORDER — FENTANYL CITRATE 0.05 MG/ML IJ SOLN
INTRAMUSCULAR | Status: AC
  Filled 2014-01-07: qty 5

## 2014-01-07 MED ORDER — DEXAMETHASONE SODIUM PHOSPHATE 10 MG/ML IJ SOLN
INTRAMUSCULAR | Status: DC | PRN
  Administered 2014-01-07: 10 mg via INTRAVENOUS

## 2014-01-07 MED ORDER — NEOSTIGMINE METHYLSULFATE 10 MG/10ML IV SOLN
INTRAVENOUS | Status: DC | PRN
  Administered 2014-01-07: 4 mg via INTRAVENOUS

## 2014-01-07 MED ORDER — MIDAZOLAM HCL 2 MG/2ML IJ SOLN
INTRAMUSCULAR | Status: AC
  Filled 2014-01-07: qty 2

## 2014-01-07 MED ORDER — GLYCOPYRROLATE 0.2 MG/ML IJ SOLN
INTRAMUSCULAR | Status: AC
  Filled 2014-01-07: qty 4

## 2014-01-07 MED ORDER — MORPHINE SULFATE 4 MG/ML IJ SOLN
2.0000 mg | INTRAMUSCULAR | Status: DC | PRN
  Administered 2014-01-07 – 2014-01-08 (×2): 2 mg via INTRAVENOUS
  Filled 2014-01-07 (×3): qty 1

## 2014-01-07 MED ORDER — MIDAZOLAM HCL 2 MG/2ML IJ SOLN
INTRAMUSCULAR | Status: DC | PRN
  Administered 2014-01-07: 2 mg via INTRAVENOUS

## 2014-01-07 MED ORDER — MEPERIDINE HCL 25 MG/ML IJ SOLN: 6.2500 mg | INTRAMUSCULAR | Status: DC | PRN

## 2014-01-07 MED ORDER — LIDOCAINE HCL (CARDIAC) 20 MG/ML IV SOLN
INTRAVENOUS | Status: AC
  Filled 2014-01-07: qty 5

## 2014-01-07 MED ORDER — PROPOFOL 10 MG/ML IV EMUL
INTRAVENOUS | Status: AC
  Filled 2014-01-07: qty 20

## 2014-01-07 MED ORDER — SCOPOLAMINE 1 MG/3DAYS TD PT72
1.0000 | MEDICATED_PATCH | TRANSDERMAL | Status: DC
  Administered 2014-01-07: 1.5 mg via TRANSDERMAL

## 2014-01-07 MED ORDER — PANTOPRAZOLE SODIUM 40 MG IV SOLR
40.0000 mg | Freq: Every day | INTRAVENOUS | Status: DC
  Filled 2014-01-07: qty 40

## 2014-01-07 MED ORDER — LIDOCAINE HCL (CARDIAC) 20 MG/ML IV SOLN
INTRAVENOUS | Status: DC | PRN
  Administered 2014-01-07: 60 mg via INTRAVENOUS

## 2014-01-07 MED ORDER — NEOSTIGMINE METHYLSULFATE 10 MG/10ML IV SOLN
INTRAVENOUS | Status: AC
  Filled 2014-01-07: qty 1

## 2014-01-07 MED ORDER — ACETAMINOPHEN 10 MG/ML IV SOLN
INTRAVENOUS | Status: DC | PRN
  Administered 2014-01-07: 1000 mg via INTRAVENOUS

## 2014-01-07 MED ORDER — ARTIFICIAL TEARS OP OINT
TOPICAL_OINTMENT | OPHTHALMIC | Status: DC | PRN
  Administered 2014-01-07: 1 via OPHTHALMIC

## 2014-01-07 MED ORDER — ROPIVACAINE HCL 5 MG/ML IJ SOLN
INTRAMUSCULAR | Status: DC | PRN
  Administered 2014-01-07: 74 mL

## 2014-01-07 MED ORDER — SCOPOLAMINE 1 MG/3DAYS TD PT72
MEDICATED_PATCH | TRANSDERMAL | Status: AC
  Filled 2014-01-07: qty 1

## 2014-01-07 MED ORDER — GLYCOPYRROLATE 0.2 MG/ML IJ SOLN
INTRAMUSCULAR | Status: DC | PRN
  Administered 2014-01-07: 0.1 mg via INTRAVENOUS
  Administered 2014-01-07: 0.6 mg via INTRAVENOUS

## 2014-01-07 MED ORDER — DEXTROSE-NACL 5-0.45 % IV SOLN
INTRAVENOUS | Status: DC
  Administered 2014-01-07 – 2014-01-08 (×2): via INTRAVENOUS

## 2014-01-07 MED ORDER — ONDANSETRON HCL 4 MG/2ML IJ SOLN
INTRAMUSCULAR | Status: DC | PRN
  Administered 2014-01-07: 4 mg via INTRAVENOUS

## 2014-01-07 MED ORDER — ACETAMINOPHEN 10 MG/ML IV SOLN
1000.0000 mg | Freq: Once | INTRAVENOUS | Status: DC
  Filled 2014-01-07: qty 100

## 2014-01-07 MED ORDER — ROCURONIUM BROMIDE 100 MG/10ML IV SOLN
INTRAVENOUS | Status: DC | PRN
  Administered 2014-01-07: 40 mg via INTRAVENOUS
  Administered 2014-01-07: 20 mg via INTRAVENOUS
  Administered 2014-01-07: 10 mg via INTRAVENOUS

## 2014-01-07 SURGICAL SUPPLY — 60 items
ADH SKN CLS APL DERMABOND .7 (GAUZE/BANDAGES/DRESSINGS) ×2
APL SKNCLS STERI-STRIP NONHPOA (GAUZE/BANDAGES/DRESSINGS) ×2
BARRIER ADHS 3X4 INTERCEED (GAUZE/BANDAGES/DRESSINGS) ×4 IMPLANT
BENZOIN TINCTURE PRP APPL 2/3 (GAUZE/BANDAGES/DRESSINGS) ×4 IMPLANT
BRR ADH 4X3 ABS CNTRL BYND (GAUZE/BANDAGES/DRESSINGS) ×2
CLOSURE WOUND 1/4X4 (GAUZE/BANDAGES/DRESSINGS) ×1
CLOTH BEACON ORANGE TIMEOUT ST (SAFETY) ×4 IMPLANT
CONT PATH 16OZ SNAP LID 3702 (MISCELLANEOUS) ×4 IMPLANT
COVER MAYO STAND STRL (DRAPES) ×4 IMPLANT
COVER TABLE BACK 60X90 (DRAPES) ×8 IMPLANT
COVER TIP SHEARS 8 DVNC (MISCELLANEOUS) ×2 IMPLANT
COVER TIP SHEARS 8MM DA VINCI (MISCELLANEOUS) ×2
DECANTER SPIKE VIAL GLASS SM (MISCELLANEOUS) ×4 IMPLANT
DERMABOND ADVANCED (GAUZE/BANDAGES/DRESSINGS) ×2
DERMABOND ADVANCED .7 DNX12 (GAUZE/BANDAGES/DRESSINGS) ×2 IMPLANT
DRAPE HUG U DISPOSABLE (DRAPE) ×4 IMPLANT
DRAPE WARM FLUID 44X44 (DRAPE) ×4 IMPLANT
DRSG COVADERM PLUS 2X2 (GAUZE/BANDAGES/DRESSINGS) ×10 IMPLANT
DRSG OPSITE POSTOP 3X4 (GAUZE/BANDAGES/DRESSINGS) ×4 IMPLANT
DURAPREP 26ML APPLICATOR (WOUND CARE) ×4 IMPLANT
ELECT REM PT RETURN 9FT ADLT (ELECTROSURGICAL) ×4
ELECTRODE REM PT RTRN 9FT ADLT (ELECTROSURGICAL) ×2 IMPLANT
EVACUATOR SMOKE 8.L (FILTER) ×4 IMPLANT
GAUZE VASELINE 3X9 (GAUZE/BANDAGES/DRESSINGS) IMPLANT
GLOVE BIOGEL PI IND STRL 7.0 (GLOVE) ×8 IMPLANT
GLOVE BIOGEL PI INDICATOR 7.0 (GLOVE) ×8
GLOVE ECLIPSE 6.5 STRL STRAW (GLOVE) ×16 IMPLANT
GOWN STRL REUS W/TWL LRG LVL3 (GOWN DISPOSABLE) ×40 IMPLANT
KIT ACCESSORY DA VINCI DISP (KITS) ×2
KIT ACCESSORY DVNC DISP (KITS) ×2 IMPLANT
LEGGING LITHOTOMY PAIR STRL (DRAPES) ×4 IMPLANT
NEEDLE INSUFFLATION 120MM (ENDOMECHANICALS) ×4 IMPLANT
OCCLUDER COLPOPNEUMO (BALLOONS) ×2 IMPLANT
PACK ROBOT WH (CUSTOM PROCEDURE TRAY) ×4 IMPLANT
PAD PREP 24X48 CUFFED NSTRL (MISCELLANEOUS) ×8 IMPLANT
PROTECTOR NERVE ULNAR (MISCELLANEOUS) ×8 IMPLANT
SET CYSTO W/LG BORE CLAMP LF (SET/KITS/TRAYS/PACK) ×4 IMPLANT
SET IRRIG TUBING LAPAROSCOPIC (IRRIGATION / IRRIGATOR) ×4 IMPLANT
STRIP CLOSURE SKIN 1/4X4 (GAUZE/BANDAGES/DRESSINGS) ×3 IMPLANT
SUT VIC AB 0 CT1 27 (SUTURE) ×20
SUT VIC AB 0 CT1 27XBRD ANBCTR (SUTURE) ×10 IMPLANT
SUT VICRYL 0 UR6 27IN ABS (SUTURE) ×4 IMPLANT
SUT VICRYL RAPIDE 4/0 PS 2 (SUTURE) ×8 IMPLANT
SUT VLOC 180 0 9IN  GS21 (SUTURE) ×2
SUT VLOC 180 0 9IN GS21 (SUTURE) ×2 IMPLANT
SYRINGE 60CC LL (MISCELLANEOUS) ×4 IMPLANT
SYSTEM CONVERTIBLE TROCAR (TROCAR) IMPLANT
TIP RUMI ORANGE 6.7MMX12CM (TIP) IMPLANT
TIP UTERINE 5.1X6CM LAV DISP (MISCELLANEOUS) ×2 IMPLANT
TIP UTERINE 6.7X10CM GRN DISP (MISCELLANEOUS) IMPLANT
TIP UTERINE 6.7X6CM WHT DISP (MISCELLANEOUS) IMPLANT
TIP UTERINE 6.7X8CM BLUE DISP (MISCELLANEOUS) IMPLANT
TOWEL OR 17X24 6PK STRL BLUE (TOWEL DISPOSABLE) ×8 IMPLANT
TRAY FOLEY CATH 14FR (SET/KITS/TRAYS/PACK) ×4 IMPLANT
TROCAR DILATING TIP 12MM 150MM (ENDOMECHANICALS) ×4 IMPLANT
TROCAR DISP BLADELESS 8 DVNC (TROCAR) ×2 IMPLANT
TROCAR DISP BLADELESS 8MM (TROCAR) ×2
TROCAR XCEL NON-BLD 5MMX100MML (ENDOMECHANICALS) ×4 IMPLANT
WARMER LAPAROSCOPE (MISCELLANEOUS) ×4 IMPLANT
WATER STERILE IRR 1000ML POUR (IV SOLUTION) ×12 IMPLANT

## 2014-01-07 NOTE — Anesthesia Preprocedure Evaluation (Signed)
Anesthesia Evaluation  Patient identified by MRN, date of birth, ID band Patient awake    Reviewed: Allergy & Precautions, H&P , NPO status , Patient's Chart, lab work & pertinent test results, reviewed documented beta blocker date and time   History of Anesthesia Complications (+) PONV and history of anesthetic complications  Airway Mallampati: II TM Distance: >3 FB Neck ROM: full    Dental  (+) Teeth Intact   Pulmonary neg pulmonary ROS, former smoker,  breath sounds clear to auscultation  Pulmonary exam normal       Cardiovascular Exercise Tolerance: Good negative cardio ROS  Rhythm:regular Rate:Normal  hyperlipidemia   Neuro/Psych negative neurological ROS  negative psych ROS   GI/Hepatic negative GI ROS, Neg liver ROS,   Endo/Other  negative endocrine ROS  Renal/GU negative Renal ROS  Female GU complaint     Musculoskeletal  (+) Arthritis - (upper spine),   Abdominal   Peds  Hematology negative hematology ROS (+)   Anesthesia Other Findings   Reproductive/Obstetrics negative OB ROS                           Anesthesia Physical Anesthesia Plan  ASA: II  Anesthesia Plan: General ETT   Post-op Pain Management:    Induction:   Airway Management Planned:   Additional Equipment:   Intra-op Plan:   Post-operative Plan:   Informed Consent: I have reviewed the patients History and Physical, chart, labs and discussed the procedure including the risks, benefits and alternatives for the proposed anesthesia with the patient or authorized representative who has indicated his/her understanding and acceptance.   Dental Advisory Given  Plan Discussed with: CRNA and Surgeon  Anesthesia Plan Comments:         Anesthesia Quick Evaluation

## 2014-01-07 NOTE — Op Note (Signed)
01/07/2014  10:55 AM  PATIENT:  April Sawyer  63 y.o. female  PRE-OPERATIVE DIAGNOSIS:  fibroids,family histroy ovarian cancer, thickened endometrium  POST-OPERATIVE DIAGNOSIS:  fibroids, thickened endometrium, history of family cancer  PROCEDURE:  Procedure(s): ROBOTIC ASSISTED TOTAL HYSTERECTOMY WITH BILATERAL SALPINGO OOPHORECTOMY CYSTOSCOPY  SURGEON:  Cala Kruckenberg SUZANNE  ASSISTANTS: Josefa Half   ANESTHESIA:   general  ESTIMATED BLOOD LOSS: 50cc  BLOOD ADMINISTERED:none   FLUIDS: 1500ccLR  UOP: 500cc  SPECIMEN:  Uterus, cervix, bilateral tubes (right tube partially absent), bilateral ovaries  DISPOSITION OF SPECIMEN:  PATHOLOGY  FINDINGS: adhesions of uterine serosa to the lower uterine segment, left bowel adhesions to left lower quadrant, partially absent right tube, 4cm right sided uterine fibroids  DESCRIPTION OF OPERATION: Patient is taken to the operating room. She is placed in the supine position. She is a running IV in place. Informed consent was present on the chart. SCDs on her lower extremities and functioning properly. General endotracheal anesthesia was administered by the anesthesia staff without difficulty. Dr. Blythe Stanford oversaw case. Once adequate anesthesia was confirmed the legs are placed in the low lithotomy position in [place]. TrenGuard was used for positioning and to prevent any sliding once the pt was in Trendelenburg positioning.    Dura prep was then used to prep the abdomen and Betadine was used to prep the inner thighs, perineum and vagina. Once 3 minutes had past the patient was draped in a normal standard fashion. The legs were lifted to the high lithotomy position. The cervix was visualized by placing a heavy weighted speculum in the posterior aspect of the vagina and using a curved Deaver retractor to the retract anteriorly. The anterior lip of the cervix was grasped with single-tooth tenaculum.  The cervix sounded to 7.5 cm. Pratt  dilators were used to dilate the cervix up to a #19. A RUMI uterine manipulator was obtained. A #6 disposable tip was placed on the RUMI manipulator as well as a medium KOH ring. This was passed through the cervix and the bulb of the disposable tip was inflated with 6 cc of normal saline. There was a good fit of the KOH ring around the cervix. The tenaculum was removed. There is also good manipulation of the uterus. The speculum and retractor were removed as well. A Foley catheter was placed to straight drain. The concentrated urine was noted. Legs were lowered to the low lithotomy position and attention was turned the abdomen.  Ropivacaine mixture (0.5% mixed one-to-one with normal saline) was used the skin in the mid clavicular line and 2cm below costal margin for a LUQ port placement.  A 8mm non-bladed trochar with laparoscope was passed through the skin and abdominal wall layers until the peritoneum was popped through.  CO2 gas was applied and pneumoperitoneum was achieved without difficulty.   Once 3.5 liters of gas was in the abdomen the pelvis and upp abdomen was surveyed.  There were bowel adhesions to the LLQ but these were filmy.  The right tube was partially absent.  The fibroid was noted of the right side of the uterus.  Atrophic ovaries were noted.    After the skin was anesthetized at the umbilicus, and a 25ZD skin incision was made, a 12 mm bladed trochar and port were passed under direct visualization.  The skin was transilluminated and the skin was anesthetized with ropivacaine mixture. 34mm skin incisions were made about 10 cm lateral to the umbilicus on each side. Then 36mm nondisposable trocar ports  were passed directly into the abdomen. 60cc ropivacaine mixture was placed in the pelvis.   All trochars were removed.  The table was placed on the floor and the patient was placed in steep Trendelenburg.  The robot was docked in a normal standard fashion to the left of the table. In the #1 arm was  placed endoscopic scissors with monopolar cautery attached and then the #2 arm was placed PK [place] with bipolar cautery attached. The systems are right side of the table for the right lower quadrant incision was located.  The adhesions in the LLQ were taken down using sharp dissection.  Ureters were noted on both sides. Then attention was turned to the right side. With uterus on stretch the right IP ligament was serially clamped cauterized and incised. Right round ligament was serially clamped cauterized and incised. The anterior and posterior peritoneum of the inferior leaf of the broad ligament were opened. There were some serosal adhesions to the lower uterine segment.  These were taken down sharply.  The bladder was taken down below the level of the KOH ring. The right uterine artery skeletonized and then just superior to the KOH ring this vessel was serially clamped, cauterized, and incised.  Attention was turned the left side  The left IP ligament was serially clamped cauterized and incised. Next the left round ligament was serially clamped cauterized and incised. The anterior posterior peritoneum of the inferiorly for the broad ligament were opened. The anterior peritoneum was carried across to the dissection on the right side. The remainder of the bladder flap was created using sharp dissection. The bladder was well below the level of the KOH ring. The left uterine artery skeletonized. Then the left uterine artery, above the level of the KOH ring, was serially clamped cauterized and incised. The uterus was devascularized at this point.  The colpotomy was performed a starting in the midline and using monopolar cautery with an open edge of the scissors. This was carried around a circumferential fashion until the vaginal mucosa was completely incised in the specimen was freed.  The specimen was then delivered to the vagina.  A vaginal occlusive device was used to maintain the  pneumoperitoneum  Instruments were changed with a needle cut suture driver placed in arm 1 and a Cobra grasper placed #2. A V. lock suture was passed through the middle port. Starting in the right angle, the cuff was closed incorporating the anterior and posterior vaginal mucosa in each stitch. This was carried across all the way to the left corner and a running fashion. Two stitches were brought back towards the midline and the suture was cut flush with the vagina. When the first stitch was placed on the right corner, a small vessel began to bleed.  Once the stitch was pulled through, the bleeding stopped but there was a small hematoma that was present.  It was monitored after the cuff was closed.  It was not expanding.  Decision was made to leave it alone.  The needle was brought out the pelvis. The pelvis was irrigated. All pedicles were inspected. No bleeding was noted. In Interceed was placed across vaginal cuff. Ureters were noted deep in the pelvis to be peristalsing.  At this point the procedure was completed. The instruments were removed. The robot was undocked. The patient was taken out of Trendelenburg positioning. The ports were removed under direct vision shove laparoscope and the pneumoperitoneum was relieved. Several deep breaths were given to the patient's trying to  any gas the abdomen and finally the midline port was removed.  The midline port was closed at the fascial level with figure-of-eight suture of #0 Vicryl. The skin was then closed with subcuticular stitches of 3-0 Vicryl. The skin was cleansed Dermabond was applied. Attention was then turned the vagina and the cuff was inspected. No bleeding was noted. The anterior posterior vaginal mucosa was incorporated in each stitch. The Foley catheter was removed.  Cystoscopy was performed.  Ureteral orifices were seen with normal urine jets coming from both sides.  There were no sutures in the bladder and no defects.  There was a hair in the  bladder which was removed with a small cystoscopic grasper.  The cystoscopy was ended and foley was replaced. Sponge, lap, needle,and instrument counts were correct x2. Patient tolerated the procedure very well. She was awakened from anesthesia, extubated and taken to recovery in stable condition.     COUNTS:  YES  PLAN OF CARE: Transfer to PACU

## 2014-01-07 NOTE — H&P (Signed)
April Sawyer is an 63 y.o. female  (G10 P2082) MWF here for robotic TLH/BSO due to uterine fibroids and family hx of ovarian cancer.  Pt has long hx of fibroids and has been undergoing yearly ultrasounds.  After menopause, her uterine size did decrease but this year her fibroid increased from 3.8 to 4.4cm.  She has contemplated hysterectomy for years.  Now, she is ready to proceed and would desire removal of ovaries as well.  Hx if significant for multiple ectopic pregnancies--one treated with laparotomy and three with laparoscopy.  She does have one tube that is absent.  Risks and benefits have been clearly discussed and she is here and ready to proceed.  Pertinent Gynecological History: Menses: post-menopausal Bleeding: none Contraception: post menopausal status DES exposure: denies Blood transfusions: none Sexually transmitted diseases: no past history Previous GYN Procedures: non except for ectopic pregnancy hx  Last mammogram: normal Date: 12/14 Last pap: normal Date: 2/15 OB History: G10, P2082   Menstrual History: Patient's last menstrual period was 12/03/2008.    Past Medical History  Diagnosis Date  . Hyperlipidemia   . Fibroid      8/12-3.8cm  . Hyperlipidemia   . Tinnitus     right ear  . 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 SVD (spontaneous vaginal delivery)     x 2  . PONV (postoperative nausea and vomiting)   . Arthritis     neck, hands  . Arthritis     in the back     Past Surgical History  Procedure Laterality Date  . Laparoscopy for ectopic pregnancy  1987, 1990, 1992    x three, salpingectomy  . Lithotripsy  2004  . Tonsillectomy and adenoidectomy    . Laparotomy  1986  . Wisdom tooth extraction  1973  . Colonoscopy      Family History  Problem Relation Age of Onset  . Colon cancer Sister   . Breast cancer Sister   . Ovarian cancer Mother 65    Social History:  reports that she quit smoking about 38 years ago. Her smoking use included Cigarettes. She  smoked 0.00 packs per day. She has never used smokeless tobacco. She reports that she drinks about 3 ounces of alcohol per week. She reports that she does not use illicit drugs.  Allergies:  Allergies  Allergen Reactions  . Influenza Vaccines Other (See Comments)    Allergic to Thimerosal used in flu vaccines  . Percodan [Oxycodone-Aspirin] Itching  . Thimerosal Other (See Comments)    When used in Contact Lens Solution, made corneas peel off.    Prescriptions prior to admission  Medication Sig Dispense Refill  . aspirin 81 MG tablet Take 81 mg by mouth daily.      Marland Kitchen atorvastatin (LIPITOR) 10 MG tablet Take 10 mg by mouth daily.      . Calcium Carbonate-Vitamin D (CALCIUM + D PO) Take by mouth daily.      Marland Kitchen ibuprofen (ADVIL,MOTRIN) 800 MG tablet Take 1 tablet (800 mg total) by mouth every 8 (eight) hours as needed.  30 tablet  0  . Multiple Vitamins-Minerals (MULTIVITAMIN WITH MINERALS) tablet Take 1 tablet by mouth daily.      . psyllium (REGULOID) 0.52 G capsule Take 4 capsules by mouth daily.      . solifenacin (VESICARE) 5 MG tablet Take 1 tablet (5 mg total) by mouth daily. One po qd  30 tablet  12  . morphine (MSIR) 15 MG  tablet Take 1 tablet (15 mg total) by mouth every 4 (four) hours as needed for severe pain.  30 tablet  0    Review of Systems  All other systems reviewed and are negative.   Blood pressure 128/70, pulse 71, temperature 97.9 F (36.6 C), temperature source Oral, resp. rate 18, last menstrual period 12/03/2008, SpO2 96.00%. Physical Exam  Constitutional: She appears well-developed and well-nourished.  Cardiovascular: Normal rate and regular rhythm.   Respiratory: Effort normal and breath sounds normal.  GI: Soft. Bowel sounds are normal.  Skin: Skin is warm and dry.  Psychiatric: She has a normal mood and affect.    No results found for this or any previous visit (from the past 24 hour(s)).  No results found.  Assessment/Plan: 63 yo G10p2 MWF with  enlarging uterine fibroids and family hx of ovarian cancer in her mother, now desirous of definitive management.  Robotic TLH/BSO planned.  Risks and benefits discussed.  Pt here and ready to proceed.  Hale Bogus SUZANNE 01/07/2014, 6:54 AM

## 2014-01-07 NOTE — Anesthesia Postprocedure Evaluation (Signed)
  Anesthesia Post-op Note  Patient: April Sawyer  Procedure(s) Performed: Procedure(s): ROBOTIC ASSISTED TOTAL HYSTERECTOMY WITH BILATERAL SALPINGO OOPHORECTOMY (N/A) CYSTOSCOPY (N/A)  Patient Location: PACU  Anesthesia Type:General  Level of Consciousness: awake, alert  and oriented  Airway and Oxygen Therapy: Patient Spontanous Breathing  Post-op Pain: mild  Post-op Assessment: Post-op Vital signs reviewed, Patient's Cardiovascular Status Stable, Respiratory Function Stable, Patent Airway, No signs of Nausea or vomiting and Pain level controlled  Post-op Vital Signs: Reviewed and stable  Last Vitals:  Filed Vitals:   01/07/14 1100  BP: 129/62  Pulse: 77  Temp:   Resp: 15    Complications: No apparent anesthesia complications

## 2014-01-07 NOTE — Progress Notes (Signed)
Day of Surgery Procedure(s) (LRB): ROBOTIC ASSISTED TOTAL HYSTERECTOMY WITH BILATERAL SALPINGO OOPHORECTOMY (N/A) CYSTOSCOPY (N/A)  Subjective: Patient reports no nausea.  Pain control is good.  Ready for catheter to be removed.  Getting ready to eat dinner.  Objective: I have reviewed patient's vital signs, intake and output and medications.  General: alert and cooperative Resp: clear to auscultation bilaterally Cardio: regular rate and rhythm, S1, S2 normal, no murmur, click, rub or gallop GI: soft, non-tender; bowel sounds normal; no masses,  no organomegaly and incision: clean, dry and intact Vaginal Bleeding: minimal  Assessment: s/p Procedure(s): ROBOTIC ASSISTED TOTAL HYSTERECTOMY WITH BILATERAL SALPINGO OOPHORECTOMY (N/A) CYSTOSCOPY (N/A): stable and progressing well  Plan: Advance diet Encourage ambulation Consider foley removal after pt ambulated  LOS: 0 days    April Sawyer SUZANNE 01/07/2014, 6:38 PM

## 2014-01-07 NOTE — Anesthesia Procedure Notes (Signed)
Performed by: Flossie Dibble

## 2014-01-07 NOTE — Transfer of Care (Signed)
Immediate Anesthesia Transfer of Care Note  Patient: April Sawyer  Procedure(s) Performed: Procedure(s): ROBOTIC ASSISTED TOTAL HYSTERECTOMY WITH BILATERAL SALPINGO OOPHORECTOMY (N/A) CYSTOSCOPY (N/A)  Patient Location: PACU  Anesthesia Type:General  Level of Consciousness: awake, alert  and oriented  Airway & Oxygen Therapy: Patient Spontanous Breathing and Patient connected to nasal cannula oxygen  Post-op Assessment: Report given to PACU RN and Post -op Vital signs reviewed and stable  Post vital signs: Reviewed and stable  Complications: No apparent anesthesia complications

## 2014-01-08 LAB — BASIC METABOLIC PANEL
ANION GAP: 11 (ref 5–15)
BUN: 11 mg/dL (ref 6–23)
CO2: 26 meq/L (ref 19–32)
Calcium: 8.7 mg/dL (ref 8.4–10.5)
Chloride: 105 mEq/L (ref 96–112)
Creatinine, Ser: 0.77 mg/dL (ref 0.50–1.10)
GFR calc non Af Amer: 89 mL/min — ABNORMAL LOW (ref >90)
Glucose, Bld: 133 mg/dL — ABNORMAL HIGH (ref 70–99)
Potassium: 3.9 mEq/L (ref 3.7–5.3)
SODIUM: 142 meq/L (ref 137–147)

## 2014-01-08 LAB — CBC
HCT: 37.1 % (ref 36.0–46.0)
HEMOGLOBIN: 12.2 g/dL (ref 12.0–15.0)
MCH: 31.9 pg (ref 26.0–34.0)
MCHC: 32.9 g/dL (ref 30.0–36.0)
MCV: 96.9 fL (ref 78.0–100.0)
Platelets: 193 10*3/uL (ref 150–400)
RBC: 3.83 MIL/uL — ABNORMAL LOW (ref 3.87–5.11)
RDW: 12.5 % (ref 11.5–15.5)
WBC: 8.8 10*3/uL (ref 4.0–10.5)

## 2014-01-08 MED ORDER — IBUPROFEN 800 MG PO TABS
800.0000 mg | ORAL_TABLET | Freq: Four times a day (QID) | ORAL | Status: DC | PRN
  Administered 2014-01-08: 800 mg via ORAL
  Filled 2014-01-08: qty 1

## 2014-01-08 MED ORDER — HYDROMORPHONE HCL 2 MG PO TABS: 1.0000 mg | ORAL_TABLET | ORAL | Status: DC | PRN

## 2014-01-08 NOTE — Anesthesia Postprocedure Evaluation (Signed)
Anesthesia Post Note  Patient: April Sawyer  Procedure(s) Performed: Procedure(s): ROBOTIC ASSISTED TOTAL HYSTERECTOMY WITH BILATERAL SALPINGO OOPHORECTOMY (N/A) CYSTOSCOPY (N/A)  Anesthesia type: General  Patient location: [facility] Unit  Post pain: Pain level controlled  Post assessment: Post-op Vital signs reviewed  Last Vitals: BP 125/74  Pulse 68  Temp(Src) 36.6 C (Oral)  Resp 18  Ht 5' 5.5" (1.664 m)  Wt 213 lb (96.616 kg)  BMI 34.89 kg/m2  SpO2 95%  LMP 12/03/2008  Post vital signs: Reviewed  Level of consciousness: awake  Complications: No apparent anesthesia complications

## 2014-01-08 NOTE — Discharge Instructions (Signed)
Post Op Hysterectomy Instructions Please read the instructions below. Refer to these instructions for the next few weeks. These instructions provide you with general information on caring for yourself after surgery. Your caregiver may also give you specific instructions. While your treatment has been planned according to the most current medical practices available, unavoidable problems sometimes happen. If you have any problems or questions after you leave, please call your caregiver.  HOME CARE INSTRUCTIONS Healing will take time. You will have discomfort, tenderness, swelling and bruising at the operative site for a couple of weeks. This is normal and will get better as time goes on.   Only take over-the-counter or prescription medicines for pain, discomfort or fever as directed by your caregiver.   Do not take aspirin. It can cause bleeding.   Do not drive when taking pain medication.   Follow your caregivers advice regarding diet, exercise, lifting, driving and general activities.   Resume your usual diet as directed and allowed.   Get plenty of rest and sleep.   Do not douche, use tampons, or have sexual intercourse until your caregiver gives you permission. .   Take your temperature if you feel hot or flushed.   You may shower today when you get home.  No tub bath for one week.    Do not drink alcohol until you are not taking any narcotic pain medications.   Try to have someone home with you for a week or two to help with the household activities.   Be careful over the next two to three weeks with any activities at home that involve lifting, pushing, or pulling.  Listen to your body--if something feels uncomfortable to do, then don't do it.  Make sure you and your family understands everything about your operation and recovery.   Walking up stairs is fine.  Do not sign any legal documents until you feel normal again.   Keep all your follow-up appointments as recommended by  your caregiver.   PLEASE CALL THE OFFICE IF:  There is swelling, redness or increasing pain in the wound area.   Pus is coming from the wound.   You notice a bad smell from the wound or surgical dressing.   You have pain, redness and swelling from the intravenous site.   The wound is breaking open (the edges are not staying together).    You develop pain or bleeding when you urinate.   You develop abnormal vaginal discharge.   You have any type of abnormal reaction or develop an allergy to your medication.   You need stronger pain medication for your pain   SEEK IMMEDIATE MEDICAL CARE:  You develop a temperature of 100.5 or higher.   You develop abdominal pain.   You develop chest pain.   You develop shortness of breath.   You pass out.   You develop pain, swelling or redness of your leg.   You develop heavy vaginal bleeding with or without blood clots.   MEDICATIONS:  Restart your regular medications BUT wait one week before restarting all vitamins and mineral supplements.  This INCLUDES your baby Aspirin dose.  Use Motrin 800mg  every 8 hours for the next several days.  This will help you use less Percocet.  Use the MSIR 15 mg every four hours as needed for pain.  You may use an over the counter stool softener like Colace or Dulcolax to help with starting a bowel movement.  Start the day after you go home.  Warm liquids, fluids, and ambulation help too.  If you have not had a bowel movement in four days, you need to call the office.

## 2014-01-08 NOTE — Addendum Note (Signed)
Addendum created 01/08/14 0836 by Flossie Dibble, CRNA   Modules edited: Notes Section   Notes Section:  File: [id number]

## 2014-01-08 NOTE — Progress Notes (Signed)
1 Day Post-Op Procedure(s) (LRB): ROBOTIC ASSISTED TOTAL HYSTERECTOMY WITH BILATERAL SALPINGO OOPHORECTOMY (N/A) CYSTOSCOPY (N/A)  Subjective: Patient reports no nausea.  Good pain control.  Minimal bleeding.  Eating without difficulty.  No trouble walking.  Objective: I have reviewed patient's vital signs, intake and output, medications and labs.  General: alert and cooperative Resp: clear to auscultation bilaterally Cardio: regular rate and rhythm, S1, S2 normal, no murmur, click, rub or gallop GI: soft, non-tender; bowel sounds normal; no masses,  no organomegaly and incision: clean, dry and intact Extremities: extremities normal, atraumatic, no cyanosis or edema Vaginal Bleeding: minimal  Assessment: s/p Procedure(s): ROBOTIC ASSISTED TOTAL HYSTERECTOMY WITH BILATERAL SALPINGO OOPHORECTOMY (N/A) CYSTOSCOPY (N/A): progressing well  Plan: If pt voids without difficulty, D/C home.  LOS: 1 day    Hale Bogus [facility] 01/08/2014, 7:36 AM

## 2014-01-08 NOTE — Discharge Summary (Signed)
Physician Discharge Summary  Patient ID: April Sawyer MRN: [id number] DOB/AGE: Aug 12, 1950 63 y.o.  Admit date: 01/07/2014 Discharge date: 01/08/2014  Admission Diagnoses: Enlarging uterine fibroids, Family hx of ovarian cancer, hx of four prior ectopic pregnancies, hx of partial right salpingectomy  Discharge Diagnoses:  None  Discharged Condition: good  Hospital Course: Patient admitted through same day surgery.  She was taken to OR where Robotic TLH/BSO, Cystoscopy were performed.  Surgical findings included adhesions of uterine serosa to the lower uterine segment, bowel adhesions to the LLQ, uterine fibroid about the same size as the uterus, normal ovaries, normal upper abdomen.  Surgery was uneventful.  EBL 50cc.  Foley catheter was left in place.  Patient transferred to PACU where she was stable and then to 3rd floor for the remainder of her hospitalization.  During her post-op recovery, her vitals and stable and she was AF.  In evening of POD#0, she was able to transition to regular diet.  She was able to ambulate and she had good pain control.  Patient seen both in the evening of POD#0 and AM of POD#1.  In the AM of POD#1, she was without complaint.  Catheter was removed Post op hb was 12.2, decreased from 14.1, pre-operatively.  At this point, patient has not voided but is walking, having excellent pain control, having no nausea, and minimal vaginal bleeding.  Once she is able to void, she will be ready for D/C.   Consults: None  Significant Diagnostic Studies: labs: post op hb 12.2  Treatments: surgery: robotic TLH/BSO, cystoscopy  Discharge Exam: Blood pressure 125/74, pulse 68, temperature 97.9 F (36.6 C), temperature source Oral, resp. rate 18, height 5' 5.5" (1.664 m), weight 213 lb (96.616 kg), last menstrual period 12/03/2008, SpO2 95.00%. General appearance: alert and cooperative Resp: clear to auscultation bilaterally Cardio: regular rate and rhythm, S1, S2 normal, no  murmur, click, rub or gallop GI: soft, non-tender; bowel sounds normal; no masses,  no organomegaly Extremities: extremities normal, atraumatic, no cyanosis or edema Incision/Wound:clean, dry, intact  Disposition: Final discharge disposition not confirmed     Medication List         aspirin 81 MG tablet  Take 81 mg by mouth daily.     atorvastatin 10 MG tablet  Commonly known as:  LIPITOR  Take 10 mg by mouth daily.     CALCIUM + D PO  Take 1 tablet by mouth daily.     ibuprofen 800 MG tablet  Commonly known as:  ADVIL,MOTRIN  Take 800 mg by mouth every 8 (eight) hours as needed for moderate pain.     morphine 15 MG tablet  Commonly known as:  MSIR  Take 1 tablet (15 mg total) by mouth every 4 (four) hours as needed for severe pain.     multivitamin with minerals tablet  Take 1 tablet by mouth daily.     psyllium 0.52 G capsule  Commonly known as:  REGULOID  Take 4 capsules by mouth daily.     solifenacin 5 MG tablet  Commonly known as:  VESICARE  Take 5 mg by mouth daily as needed (bladder incontinence).         SignedMEGYN, LENG 01/08/2014, 7:39 AM

## 2014-01-08 NOTE — Progress Notes (Signed)
Pt discharged home with husband... Pt voided x2... Discharge instructions reviewed with pt and she verbalized understanding... Condition stable... No equipment... Taken to car via wheelchair by L. Graylon Good, NT.

## 2014-01-09 ENCOUNTER — Encounter (HOSPITAL_COMMUNITY): Payer: Self-pay | Admitting: Obstetrics & Gynecology

## 2014-01-11 ENCOUNTER — Telehealth: Payer: Self-pay

## 2014-01-11 NOTE — Telephone Encounter (Signed)
Lmtcb//kn 

## 2014-01-11 NOTE — Telephone Encounter (Signed)
Message copied by Robley Fries on Fri Jan 11, 2014  3:42 PM ------      Message from: Megan Salon      Created: Fri Jan 11, 2014 12:21 PM       Can you check on my other surgical pt as well.  Her pathology showed fibroids and a benign endometrial polyp.  I will review this with her next week.  Just make sure she's doing ok.  Thanks. ------

## 2014-01-14 ENCOUNTER — Ambulatory Visit (INDEPENDENT_AMBULATORY_CARE_PROVIDER_SITE_OTHER): Payer: Federal, State, Local not specified - PPO | Admitting: Obstetrics & Gynecology

## 2014-01-14 ENCOUNTER — Encounter: Payer: Self-pay | Admitting: Obstetrics & Gynecology

## 2014-01-14 VITALS — BP 136/70 | HR 64 | Resp 20 | Ht 65.5 in | Wt 206.8 lb

## 2014-01-14 DIAGNOSIS — Z9889 Other specified postprocedural states: Secondary | ICD-10-CM

## 2014-01-14 DIAGNOSIS — R197 Diarrhea, unspecified: Secondary | ICD-10-CM

## 2014-01-14 NOTE — Progress Notes (Signed)
Post Operative Visit  Procedure: R-Total Hysterectomy/BSO Days Post-op: 8  Subjective: Doing really well except for having some diarrhea.  No fevers.  No vaginal bleeding.  No discharge.  Sleeping is fine.  Emptying her bladder without difficulty.  Off narcotics since Saturday.  Using motrin only.  Definitely having some fatigue but not unexpected.  Reviewed pathology with patient.     Objective: BP 136/70  Pulse 64  Resp 20  Ht 5' 5.5" (1.664 m)  Wt 206 lb 12.8 oz (93.804 kg)  BMI 33.88 kg/m2  LMP 12/03/2008  EXAM General: alert and cooperative Resp: clear to auscultation bilaterally Cardio: regular rate and rhythm, S1, S2 normal, no murmur, click, rub or gallop GI: soft, non-tender; bowel sounds normal; no masses,  no organomegaly and incision: clean, dry and intact Extremities: extremities normal, atraumatic, no cyanosis or edema Vaginal cuff:  Well approximated, no bleeding, minimal discharge, no odor, no fullness or masses Vaginal Bleeding: minimal  Assessment: s/p Robotic TLH/BSO, cystoscopy Diarrhea  Plan: Recheck 3 weeks Pt will start Align and can use imodium after she brings me a stool sample.  Sterile containers for stool sample given.

## 2014-01-15 NOTE — Telephone Encounter (Signed)
Patient came in yesterday 7/13 for her post op/kn

## 2014-02-04 ENCOUNTER — Encounter: Payer: Self-pay | Admitting: Obstetrics & Gynecology

## 2014-02-04 ENCOUNTER — Ambulatory Visit (INDEPENDENT_AMBULATORY_CARE_PROVIDER_SITE_OTHER): Payer: Federal, State, Local not specified - PPO | Admitting: Obstetrics & Gynecology

## 2014-02-04 VITALS — BP 124/76 | HR 60 | Resp 16 | Ht 65.5 in | Wt 213.8 lb

## 2014-02-04 DIAGNOSIS — Z9889 Other specified postprocedural states: Secondary | ICD-10-CM

## 2014-02-04 NOTE — Progress Notes (Signed)
Post Operative Visit  Procedure: R-Total hysterectomy/BSO Days Post-op: 29 days  Subjective: Doing really well.  Hasn't taken any pain medication since first week.  Doesn't really have any pain unless she does too much.  Ibuprofen helps with this.  No bleeding or vaginal discharge.  BMs back to normal.  Still taking Align but will stop.  Reports not taking the Vesicare as she has been home.  Energy is getting better.    Objective: BP 124/76  Pulse 60  Resp 16  Ht 5' 5.5" (1.664 m)  Wt 213 lb 12.8 oz (96.979 kg)  BMI 35.02 kg/m2  LMP 12/03/2008  EXAM General: alert and cooperative Resp: clear to auscultation bilaterally Cardio: regular rate and rhythm, S1, S2 normal, no murmur, click, rub or gallop GI: soft, non-tender; bowel sounds normal; no masses,  no organomegaly and incision: clean, dry and intact Extremities: extremities normal, atraumatic, no cyanosis or edema Vaginal Bleeding: none Gyn:  Healing well.  No masses or fullness  Assessment: s/p robotic assisted TLH/BOS, doing well.  Plan: Returning to work 8/19.  Pt may resume swimming.  Pt knows no intercourse for 8 weeks.

## 2014-02-18 ENCOUNTER — Telehealth: Payer: Self-pay | Admitting: Emergency Medicine

## 2014-02-18 ENCOUNTER — Ambulatory Visit (INDEPENDENT_AMBULATORY_CARE_PROVIDER_SITE_OTHER): Payer: Federal, State, Local not specified - PPO | Admitting: Obstetrics & Gynecology

## 2014-02-18 VITALS — BP 124/70 | HR 64 | Temp 98.1°F | Resp 16 | Wt 214.0 lb

## 2014-02-18 DIAGNOSIS — R319 Hematuria, unspecified: Secondary | ICD-10-CM

## 2014-02-18 DIAGNOSIS — N3000 Acute cystitis without hematuria: Secondary | ICD-10-CM

## 2014-02-18 DIAGNOSIS — N3001 Acute cystitis with hematuria: Secondary | ICD-10-CM

## 2014-02-18 LAB — POCT URINALYSIS DIPSTICK
Bilirubin, UA: NEGATIVE
GLUCOSE UA: NEGATIVE
KETONES UA: NEGATIVE
Nitrite, UA: NEGATIVE
Urobilinogen, UA: NEGATIVE
pH, UA: 5

## 2014-02-18 MED ORDER — SULFAMETHOXAZOLE-TMP DS 800-160 MG PO TABS: 1.0000 | ORAL_TABLET | Freq: Two times a day (BID) | ORAL | Status: DC

## 2014-02-18 NOTE — Progress Notes (Signed)
Subjective:     Patient ID: April Sawyer, female   DOB: Jan 12, 1951, 63 y.o.   MRN: [id number]  HPI 61 MWF here for complaint of increased darkness in urine.  Has noticed increase dysuria and she has seen a little bit of pink on her toilet paper when she wiped.  She is pretty sure there is no vaginal bleeding.  She is going back to work Architectural technologist.  Energy is good.  Not SA yet.    Pt does have a hx of nephrolithiasis so aware if urine culture not positive, will need additional testing.  No fevers.  No back pain.   Review of Systems  All other systems reviewed and are negative.      Objective:   Physical Exam  Constitutional: She appears well-developed and well-nourished.  Abdominal: Soft. Bowel sounds are normal.  No suprapubic tenderness.  No CVA tenderness.  Genitourinary: Vagina normal. There is no rash or tenderness on the right labia. There is no rash or tenderness on the left labia.  Uterus and cervix absent.  No vaginal bleeding.  Cuff healing well with normal appearing suture line.  Lymphadenopathy:       Right: No inguinal adenopathy present.       Left: No inguinal adenopathy present.  Skin: Skin is warm and dry.  Psychiatric: She has a normal mood and affect.       Assessment:     Microscopic hematuria Possible UTI  H/O nephrolithiasis    Plan:     Bactrim DS bid x 5 days Urine culture pending

## 2014-02-18 NOTE — Telephone Encounter (Signed)
April Sawyer calling reports that she is having bleeding when urinating x 1 week, no dysuria or fevers. She has been having this problem but she is wearing pads and thought it may be vaginal bleeding but is not sure.  No pain.  Office visit with Dr. Sabra Heck for 1:00 offered and April Sawyer accepted.  Routing to provider for final review. April Sawyer agreeable to disposition. Will close encounter

## 2014-02-19 ENCOUNTER — Encounter: Payer: Self-pay | Admitting: Obstetrics & Gynecology

## 2014-02-19 DIAGNOSIS — R319 Hematuria, unspecified: Secondary | ICD-10-CM | POA: Insufficient documentation

## 2014-02-19 LAB — URINE CULTURE
COLONY COUNT: NO GROWTH
Organism ID, Bacteria: NO GROWTH

## 2014-03-01 ENCOUNTER — Telehealth: Payer: Self-pay | Admitting: Emergency Medicine

## 2014-03-01 NOTE — Telephone Encounter (Signed)
Patient notified of results and message from Dr. Sabra Heck. She is agreeable to appointment for Cath UA and is scheduled for 03/04/14 at 1000.

## 2014-03-01 NOTE — Telephone Encounter (Signed)
Message copied by Michele Mcalpine on Fri Mar 01, 2014  9:26 AM ------      Message from: Megan Salon      Created: Fri Mar 01, 2014  8:41 AM       Please call pt and let her know culture was negative.  Since there was blood in it, I would like to have her return for a cath u/a.  If this still shows blood, i'd like to then do a CT scan. ------

## 2014-03-04 ENCOUNTER — Ambulatory Visit (INDEPENDENT_AMBULATORY_CARE_PROVIDER_SITE_OTHER): Payer: Federal, State, Local not specified - PPO | Admitting: Obstetrics & Gynecology

## 2014-03-04 ENCOUNTER — Encounter: Payer: Self-pay | Admitting: Obstetrics & Gynecology

## 2014-03-04 VITALS — BP 124/70 | HR 68 | Ht 65.5 in | Wt 212.0 lb

## 2014-03-04 DIAGNOSIS — R3129 Other microscopic hematuria: Secondary | ICD-10-CM

## 2014-03-04 DIAGNOSIS — R319 Hematuria, unspecified: Secondary | ICD-10-CM

## 2014-03-05 ENCOUNTER — Telehealth: Payer: Self-pay

## 2014-03-05 LAB — URINALYSIS, MICROSCOPIC ONLY
Bacteria, UA: NONE SEEN
CRYSTALS: NONE SEEN
Casts: NONE SEEN
Squamous Epithelial / LPF: NONE SEEN

## 2014-03-05 NOTE — Telephone Encounter (Signed)
Message copied by Robley Fries on Tue Mar 05, 2014 11:28 AM ------      Message from: Megan Salon      Created: Tue Mar 05, 2014  8:57 AM       Inform pt u/a is negative for blood.  I don't think anything else needs to be done except please have her call with any new symptoms.              Note sent through MyChart:      Mrs. Maxton,      Your urinalysis was normal.  I don't think you need to do anything else at this time.  Claiborne Billings, my nurse, will be calling just to discuss results and make sure you don't have any questions.  If you have any new symptoms, please call.            Dr. Sabra Heck ------

## 2014-03-05 NOTE — Progress Notes (Signed)
Subjective:     Patient ID: April Sawyer, female   DOB: 1950/08/18, 63 y.o.   MRN: [id number]  HPI 63 yo G10 P2082 WMF here for cath u/a due to microscopic hematuria noted on office dip u/a on 02/18/14.  Pt came in with UTI-like symptoms.  She was treated with antibiotics but culture was ultimately negative.  Pt states she is really surprised by that as her symptoms seemed so much like a UTI to her and they resolved within the first 24 hours after taking the antibiotics.  Pt and I discussed possible plan.  She knows I want to be very conservative with her due to the recent surgery, although she is doing quite well and is fully back to work.  No fevers.  No pain.  Off any pain medication.  No difficulty voiding.  Review of Systems  All other systems reviewed and are negative.      Objective:   Physical Exam  Constitutional: She appears well-developed and well-nourished.  Abdominal: Soft. Bowel sounds are normal.  Genitourinary: Vagina normal. There is no rash, tenderness or lesion on the right labia. There is no rash, tenderness or lesion on the left labia.  Skin: Skin is warm and dry.  Psychiatric: She has a normal mood and affect.   Cath u/a performed under sterile conditions.  Clear urine obtained.    Assessment:     Microscopic hematuria S/p robotic assisted TLH/BSO on 01/07/14     Plan:     Cath u/a obtained.  Micro will be sent.  It RBCs still present, proceed with CT scan.  If negative, will follow pt conservatively.

## 2014-03-05 NOTE — Telephone Encounter (Signed)
9/1 lmtcb//kn

## 2014-04-08 NOTE — Telephone Encounter (Signed)
Patient notified of results, will call back with any new symptoms.//kn

## 2014-05-06 ENCOUNTER — Encounter: Payer: Self-pay | Admitting: Obstetrics & Gynecology

## 2014-09-03 ENCOUNTER — Ambulatory Visit: Payer: Federal, State, Local not specified - PPO | Admitting: Obstetrics & Gynecology

## 2015-02-18 ENCOUNTER — Ambulatory Visit (INDEPENDENT_AMBULATORY_CARE_PROVIDER_SITE_OTHER): Payer: Federal, State, Local not specified - PPO | Admitting: Obstetrics & Gynecology

## 2015-02-18 ENCOUNTER — Encounter: Payer: Self-pay | Admitting: Obstetrics & Gynecology

## 2015-02-18 VITALS — BP 128/80 | HR 68 | Resp 16 | Ht 65.5 in | Wt 211.0 lb

## 2015-02-18 DIAGNOSIS — Z01419 Encounter for gynecological examination (general) (routine) without abnormal findings: Secondary | ICD-10-CM | POA: Diagnosis not present

## 2015-02-18 DIAGNOSIS — R3915 Urgency of urination: Secondary | ICD-10-CM

## 2015-02-18 NOTE — Patient Instructions (Signed)
Anti-cholinergics Overactive bladder medications (OAB) Vesicare

## 2015-02-18 NOTE — Progress Notes (Signed)
Patient ID: April Sawyer, female   DOB: 1950/07/24, 64 y.o.   MRN: [id number]   63 y.o. [id number] MarriedCaucasianF here for annual exam.  Doing well.  No vaginal bleeding.  Reports having some increased urgency.  Hasn't really gotten worse since surgery.   PCP:  Dr. Amedeo Kinsman.  Will see her before the end of the year.  Patient's last menstrual period was 12/03/2008.          Sexually active: Yes.    The current method of family planning is post menopausal status.    Exercising: Yes.    water aerobics  Smoker:  no  Health Maintenance: Pap:  08-27-13 WNL NEG HR HPV History of abnormal Pap:  no MMG:  12-09-14 WNL BI Rads 1 [facility] Colonoscopy:  08-11-12 repeat in 5 yrs  BMD:   4/10 Normal TDaP:  02-02-06 Screening Labs: PCP does labs    reports that she quit smoking about 39 years ago. Her smoking use included Cigarettes. She has never used smokeless tobacco. She reports that she drinks about 3.0 oz of alcohol per week. She reports that she does not use illicit drugs.  Past Medical History  Diagnosis Date  . Hyperlipidemia   . Fibroid      8/12-3.8cm  . Hyperlipidemia   . Tinnitus     right ear  . 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 SVD (spontaneous vaginal delivery)     x 2  . PONV (postoperative nausea and vomiting)   . Arthritis     neck, hands  . Arthritis     in the back     Past Surgical History  Procedure Laterality Date  . Laparoscopy for ectopic pregnancy  1987, 1990, 1992    x three, salpingectomy  . Lithotripsy  2004  . Tonsillectomy and adenoidectomy    . Laparotomy  1986  . Wisdom tooth extraction  1973  . Colonoscopy    . Robotic assisted total hysterectomy with bilateral salpingo oopherectomy N/A 01/07/2014    Procedure: ROBOTIC ASSISTED TOTAL HYSTERECTOMY WITH BILATERAL SALPINGO OOPHORECTOMY;  Surgeon: Lyman Speller, MD;  Location: [facility];  Service: Gynecology;  Laterality: N/A;  . Cystoscopy N/A 01/07/2014    Procedure: CYSTOSCOPY;  Surgeon: Lyman Speller,  MD;  Location: [facility];  Service: Gynecology;  Laterality: N/A;    Current Outpatient Prescriptions  Medication Sig Dispense Refill  . atorvastatin (LIPITOR) 10 MG tablet Take 10 mg by mouth daily.    . Calcium Carbonate-Vitamin D (CALCIUM + D PO) Take 1 tablet by mouth daily.     Marland Kitchen ibuprofen (ADVIL,MOTRIN) 800 MG tablet Take 800 mg by mouth every 8 (eight) hours as needed for moderate pain.    . Multiple Vitamins-Minerals (MULTIVITAMIN WITH MINERALS) tablet Take 1 tablet by mouth daily.    . psyllium (REGULOID) 0.52 G capsule Take 4 capsules by mouth daily.    . solifenacin (VESICARE) 5 MG tablet Take 5 mg by mouth daily as needed (bladder incontinence).     No current facility-administered medications for this visit.    Family History  Problem Relation Age of Onset  . Colon cancer Sister   . Breast cancer Sister   . Ovarian cancer Mother 82    ROS:  Pertinent items are noted in HPI.  Otherwise, a comprehensive ROS was negative.  Exam:   BP 128/80 mmHg  Pulse 68  Resp 16  Ht 5' 5.5" (1.664 m)  Wt 211 lb (95.709 kg)  BMI 34.57 kg/m2  LMP 12/03/2008  Weight change: +5#   Height: 5' 5.5" (166.4 cm)  Ht Readings from Last 3 Encounters:  02/18/15 5' 5.5" (1.664 m)  03/04/14 5' 5.5" (1.664 m)  02/04/14 5' 5.5" (1.664 m)   General appearance: alert, cooperative and appears stated age Head: Normocephalic, without obvious abnormality, atraumatic Neck: no adenopathy, supple, symmetrical, trachea midline and thyroid normal to inspection and palpation Lungs: clear to auscultation bilaterally Breasts: normal appearance, no masses or tenderness Heart: regular rate and rhythm Abdomen: soft, non-tender; bowel sounds normal; no masses,  no organomegaly Extremities: extremities normal, atraumatic, no cyanosis or edema Skin: Skin color, texture, turgor normal. No rashes or lesions except for hypopigmented lesions on left shin Lymph nodes: Cervical, supraclavicular, and axillary nodes  normal. No abnormal inguinal nodes palpated Neurologic: Grossly normal   Pelvic: External genitalia:  no lesions              Urethra:  normal appearing urethra with no masses, tenderness or lesions              Bartholins and Skenes: normal                 Vagina: normal appearing vagina with normal color and discharge, no lesions              Cervix: absent              Pap taken: No. Bimanual Exam:  Uterus:  uterus absent              Adnexa: no mass, fullness, tenderness               Rectovaginal: Confirms               Anus:  normal sphincter tone, no lesions  Chaperone was present for exam.  A:  Well Woman with normal exam PMP, no HRT H/O uterine fibroid OAB Mildly elevated glucose on screening labs at work.  Being followed. Hyperlipidemia Family hx ovarian cancer in mother Family hx of breast cancer and colon cancer in one sister, currently aged 34 Hypopigmented lesion left skin--advised follow up with Dermatology  P: Mammogram yearly. D/W 3D MMG No pap today due to robotic TLH/BSO/cystoscopy 01/07/14 Pt is going to check with insurance about OAB medications and let me know so we can try one that is cheaper for her. Referral to [facility] for pelvic PT. BMD next year with MMG.  Reminder placed. return annually or prn

## 2015-03-14 ENCOUNTER — Telehealth: Payer: Self-pay | Admitting: Obstetrics & Gynecology

## 2015-03-14 NOTE — Telephone Encounter (Signed)
Called patient to update her on status of referral. Left voicemail to return call. Called [facility] earlier today and they state she is not yet scheduled. Will offer April Sawyer the phone number to [facility] if she wishes to contact their office.  [phone]

## 2015-03-14 NOTE — Telephone Encounter (Signed)
Routing to Kerry Hough for referral update to patient.

## 2015-03-14 NOTE — Telephone Encounter (Signed)
Patient wants to check on status of referral to Ile[facility] Roup at [facility] for physical therapy.

## 2015-03-20 NOTE — Telephone Encounter (Signed)
Spoke with patient regarding referral appointment. The information is listed below. She understands information including address and phone number. She is aware should she need to cancel or reschedule this appointment, she may call the office she's been referred to in order to reschedule. Ok to close.  [facility] Ileana Roup [place] [address]nd floor [place] [phone]  03/25/15 @2pm . Arrive 15 minutes early with insurance card/photo id.

## 2015-06-09 ENCOUNTER — Encounter: Payer: Self-pay | Admitting: Internal Medicine

## 2016-01-01 ENCOUNTER — Telehealth: Payer: Self-pay

## 2016-01-01 NOTE — Telephone Encounter (Signed)
-----   Message from Megan Salon, MD sent at 12/31/2015  8:18 AM EDT ----- Regarding: MMG and BMD Pt needs MMG and BMD together this summer.  Can you fill out form for me to sign and fax to [facility]  Then let pt know order has been done and she can call to schedule.  Thanks.  Vinnie Level

## 2016-01-01 NOTE — Telephone Encounter (Signed)
Signed order for MMG and BMD has been faxed to [facility] with cover sheet and confirmation. Spoke with patient who states that she had her mammogram last week. Advised Dr.Miller recommends that she has a BMD this year as well. Patient is agreeable and will contact [facility] to schedule.  Routing to provider for final review. Patient agreeable to disposition. Will close encounter.

## 2016-01-13 ENCOUNTER — Telehealth: Payer: Self-pay | Admitting: *Deleted

## 2016-01-13 NOTE — Telephone Encounter (Signed)
Called patient to review BMD results. Patient verbalized understanding. Patient aware no treatment is needed at this time and scan will be repeated in 3-5 years.

## 2016-01-14 ENCOUNTER — Encounter: Payer: Self-pay | Admitting: Obstetrics & Gynecology

## 2016-01-20 ENCOUNTER — Encounter: Payer: Self-pay | Admitting: Obstetrics & Gynecology

## 2016-06-04 ENCOUNTER — Ambulatory Visit (INDEPENDENT_AMBULATORY_CARE_PROVIDER_SITE_OTHER): Payer: Federal, State, Local not specified - PPO | Admitting: Obstetrics & Gynecology

## 2016-06-04 ENCOUNTER — Encounter: Payer: Self-pay | Admitting: Obstetrics & Gynecology

## 2016-06-04 VITALS — BP 126/70 | HR 80 | Resp 14 | Ht 65.25 in | Wt 210.6 lb

## 2016-06-04 DIAGNOSIS — Z205 Contact with and (suspected) exposure to viral hepatitis: Secondary | ICD-10-CM

## 2016-06-04 DIAGNOSIS — Z23 Encounter for immunization: Secondary | ICD-10-CM

## 2016-06-04 DIAGNOSIS — Z0000 Encounter for general adult medical examination without abnormal findings: Secondary | ICD-10-CM | POA: Diagnosis not present

## 2016-06-04 DIAGNOSIS — Z01419 Encounter for gynecological examination (general) (routine) without abnormal findings: Secondary | ICD-10-CM | POA: Diagnosis not present

## 2016-06-04 LAB — POCT URINALYSIS DIPSTICK
BILIRUBIN UA: NEGATIVE
Blood, UA: NEGATIVE
GLUCOSE UA: NEGATIVE
KETONES UA: NEGATIVE
Nitrite, UA: NEGATIVE
Protein, UA: NEGATIVE
Urobilinogen, UA: NEGATIVE
pH, UA: 5

## 2016-06-04 LAB — TSH: TSH: 1.22 m[IU]/L

## 2016-06-04 LAB — CBC
HCT: 42.6 % (ref 35.0–45.0)
Hemoglobin: 13.8 g/dL (ref 11.7–15.5)
MCH: 32.3 pg (ref 27.0–33.0)
MCHC: 32.4 g/dL (ref 32.0–36.0)
MCV: 99.8 fL (ref 80.0–100.0)
MPV: 8.7 fL (ref 7.5–12.5)
PLATELETS: 234 10*3/uL (ref 140–400)
RBC: 4.27 MIL/uL (ref 3.80–5.10)
RDW: 13.4 % (ref 11.0–15.0)
WBC: 5.2 10*3/uL (ref 3.8–10.8)

## 2016-06-04 NOTE — Progress Notes (Signed)
65 y.o. [id number] Married Caucasian F here for annual exam.  Going to retire in April.  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  House is on the market.  Planning to move to [place].    Denies vaginal bleeding.    Has new PCP but hasn't seen this person yet.  PCP moved.  Patient's last menstrual period was 12/03/2008.          Sexually active: Yes.    The current method of family planning is status post hysterectomy.    Exercising: Yes.    water aerobics Smoker:  Former smoker  Health Maintenance: Pap:  08/27/13 negative, HR HPV negative History of abnormal Pap:  no MMG:  12/25/15 ultrasound: BIRADS 2 benign  Colonoscopy:  08/11/12 repeat 5 years BMD:   01/08/16 mild osteopenia- repeat 3-5 years  TDaP:  2007- would like to get today  Pneumonia vaccine(s):  never Zostavax:   ~3 years ago  Hep C testing: drawn today  Screening Labs: drawn today, Hb today: drawn today, Urine today: moderate WBC's   reports that she quit smoking about 40 years ago. Her smoking use included Cigarettes. She has never used smokeless tobacco. She reports that she drinks about 3.0 oz of alcohol per week . She reports that she does not use drugs.  Past Medical History:  Diagnosis Date  . Arthritis    neck, hands  . Arthritis    in the back   . Bulging disc 2010   L3-L5  . Fibroid     8/12-3.8cm  . Hyperlipidemia   . Hyperlipidemia   . PONV (postoperative nausea and vomiting)   . SVD (spontaneous vaginal delivery)    x 2  . Tinnitus    right ear    Past Surgical History:  Procedure Laterality Date  . ABDOMINAL HYSTERECTOMY    . COLONOSCOPY    . CYSTOSCOPY N/A 01/07/2014   Procedure: CYSTOSCOPY;  Surgeon: Lyman Speller, MD;  Location: [facility];  Service: Gynecology;  Laterality: N/A;  . [place]   x three, salpingectomy  . LAPAROTOMY  1986  . LITHOTRIPSY  2004  . ROBOTIC ASSISTED TOTAL HYSTERECTOMY WITH BILATERAL SALPINGO  OOPHERECTOMY N/A 01/07/2014   Procedure: ROBOTIC ASSISTED TOTAL HYSTERECTOMY WITH BILATERAL SALPINGO OOPHORECTOMY;  Surgeon: Lyman Speller, MD;  Location: [facility];  Service: Gynecology;  Laterality: N/A;  . TONSILLECTOMY AND ADENOIDECTOMY    . WISDOM TOOTH EXTRACTION  1973    Current Outpatient Prescriptions  Medication Sig Dispense Refill  . atorvastatin (LIPITOR) 10 MG tablet Take 10 mg by mouth daily.    . Calcium Carbonate-Vitamin D (CALCIUM + D PO) Take 1 tablet by mouth daily.     Marland Kitchen ibuprofen (ADVIL,MOTRIN) 800 MG tablet Take 800 mg by mouth every 8 (eight) hours as needed for moderate pain.    . Multiple Vitamins-Minerals (MULTIVITAMIN WITH MINERALS) tablet Take 1 tablet by mouth daily.    . Probiotic Product (ALIGN PO) Take by mouth.    . psyllium (REGULOID) 0.52 G capsule Take 4 capsules by mouth daily.     No current facility-administered medications for this visit.     Family History  Problem Relation Age of Onset  . Colon cancer Sister   . Breast cancer Sister   . Ovarian cancer Mother 69    ROS:  Pertinent items are noted in HPI.  Otherwise, a comprehensive ROS was negative.  Exam:  BP 126/70 (BP Location: Right Arm, Patient Position: Sitting, Cuff Size: Normal)   Pulse 80   Resp 14   Ht 5' 5.25" (1.657 m)   Wt 210 lb 9.6 oz (95.5 kg)   LMP 12/03/2008   BMI 34.78 kg/m   Weight change: -1#   Height: 5' 5.25" (165.7 cm)  Ht Readings from Last 3 Encounters:  06/04/16 5' 5.25" (1.657 m)  02/18/15 5' 5.5" (1.664 m)  03/04/14 5' 5.5" (1.664 m)   General appearance: alert, cooperative and appears stated age Head: Normocephalic, without obvious abnormality, atraumatic Neck: no adenopathy, supple, symmetrical, trachea midline and thyroid normal to inspection and palpation Lungs: clear to auscultation bilaterally Breasts: normal appearance, no masses or tenderness Heart: regular rate and rhythm Abdomen: soft, non-tender; bowel sounds normal; no masses,  no  organomegaly Extremities: extremities normal, atraumatic, no cyanosis or edema Skin: Skin color, texture, turgor normal. No rashes or lesions Lymph nodes: Cervical, supraclavicular, and axillary nodes normal. No abnormal inguinal nodes palpated Neurologic: Grossly normal   Pelvic: External genitalia:  no lesions              Urethra:  normal appearing urethra with no masses, tenderness or lesions              Bartholins and Skenes: normal                 Vagina: normal appearing vagina with normal color and discharge, no lesions              Cervix: absent              Pap taken: No. Bimanual Exam:  Uterus:  uterus absent              Adnexa: no mass, fullness, tenderness               Rectovaginal: Confirms               Anus:  normal sphincter tone, no lesions  Chaperone was present for exam.  A:     Well Woman with normal exam PMP, no HRT OAB H/O mildly elevated glucose on screening lab work dome through work.   Hyperlipidemia Family hx ovarian cancer in mother Family hx of breast cancer and colon cancer in one sister, currently in her 11's  P: Mammogram yearly. D/W 3D MMG No pap today due to robotic TLH/BSO/cystoscopy 01/07/14 Tdap today CMP, CBC, Lipids, TSH, Vit D Hep C antibody obtained today Return annually or prn

## 2016-06-05 LAB — COMPREHENSIVE METABOLIC PANEL
ALT: 15 U/L (ref 6–29)
AST: 20 U/L (ref 10–35)
Albumin: 4 g/dL (ref 3.6–5.1)
Alkaline Phosphatase: 68 U/L (ref 33–130)
BILIRUBIN TOTAL: 0.8 mg/dL (ref 0.2–1.2)
BUN: 15 mg/dL (ref 7–25)
CALCIUM: 9 mg/dL (ref 8.6–10.4)
CO2: 29 mmol/L (ref 20–31)
Chloride: 102 mmol/L (ref 98–110)
Creat: 0.88 mg/dL (ref 0.50–0.99)
GLUCOSE: 94 mg/dL (ref 65–99)
POTASSIUM: 3.9 mmol/L (ref 3.5–5.3)
Sodium: 141 mmol/L (ref 135–146)
TOTAL PROTEIN: 6.6 g/dL (ref 6.1–8.1)

## 2016-06-05 LAB — HEPATITIS C ANTIBODY: HCV Ab: NEGATIVE

## 2016-06-05 LAB — LIPID PANEL
CHOL/HDL RATIO: 3 ratio (ref <5.0)
Cholesterol: 202 mg/dL — ABNORMAL HIGH (ref <200)
HDL: 67 mg/dL (ref >50)
LDL CALC: 112 mg/dL — AB (ref <100)
Triglycerides: 117 mg/dL (ref <150)
VLDL: 23 mg/dL (ref <30)

## 2016-06-05 LAB — VITAMIN D 25 HYDROXY (VIT D DEFICIENCY, FRACTURES): VIT D 25 HYDROXY: 38 ng/mL (ref 30–100)

## 2017-08-11 ENCOUNTER — Ambulatory Visit
Admission: RE | Admit: 2017-08-11 | Discharge: 2017-08-11 | Disposition: A | Discharge status: Home / Self Care | Payer: Federal, State, Local not specified - PPO | Source: Ambulatory Visit | Attending: Internal Medicine | Admitting: Internal Medicine

## 2017-08-11 ENCOUNTER — Other Ambulatory Visit: Payer: Self-pay | Admitting: Internal Medicine

## 2017-08-11 DIAGNOSIS — M20001 Unspecified deformity of right finger(s): Secondary | ICD-10-CM

## 2017-08-24 ENCOUNTER — Encounter: Payer: Self-pay | Admitting: Gastroenterology

## 2019-05-26 IMAGING — DX DG FINGER LITTLE 2+V*R*
3 series · 3 of 3 positions shown · non-contrast
Comparison: None.

CLINICAL DATA: Little finger deviation. Growth at the DIP joint for
the past 6 months.

EXAM:
RIGHT LITTLE FINGER 2+V

[dg finger little right (1 of 3)]
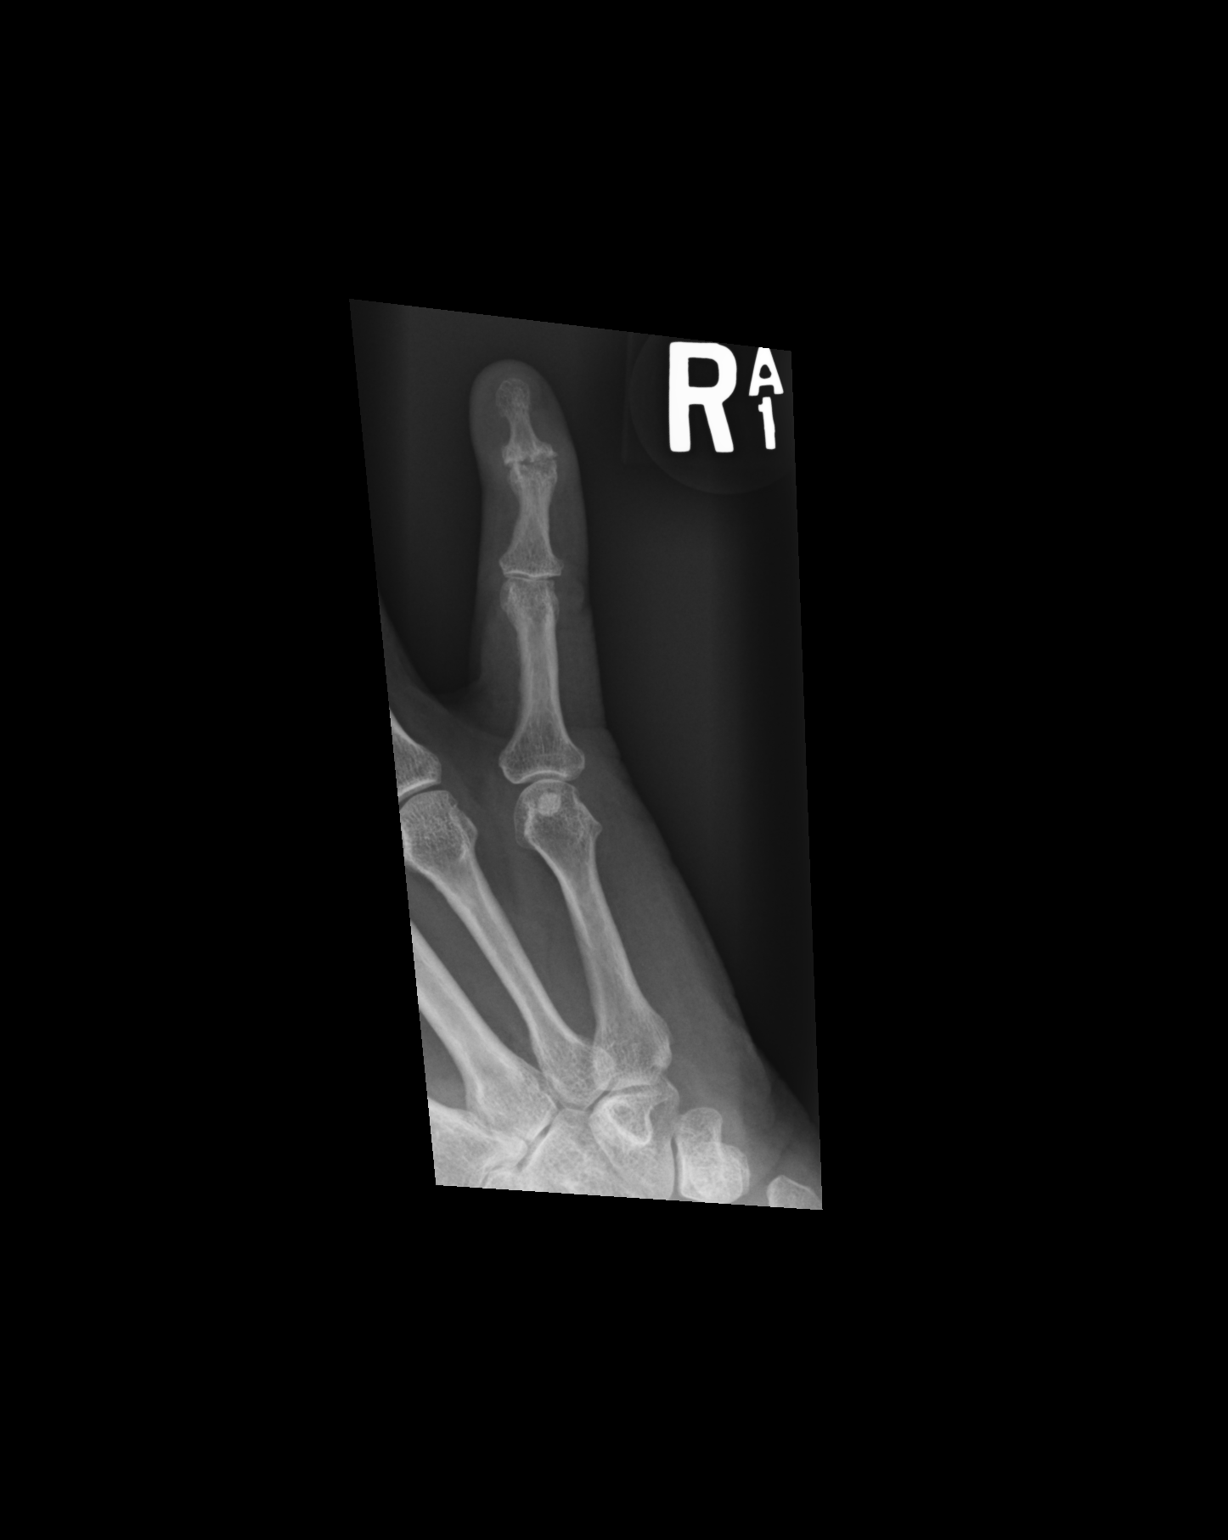

[dg finger little right (2 of 3)]
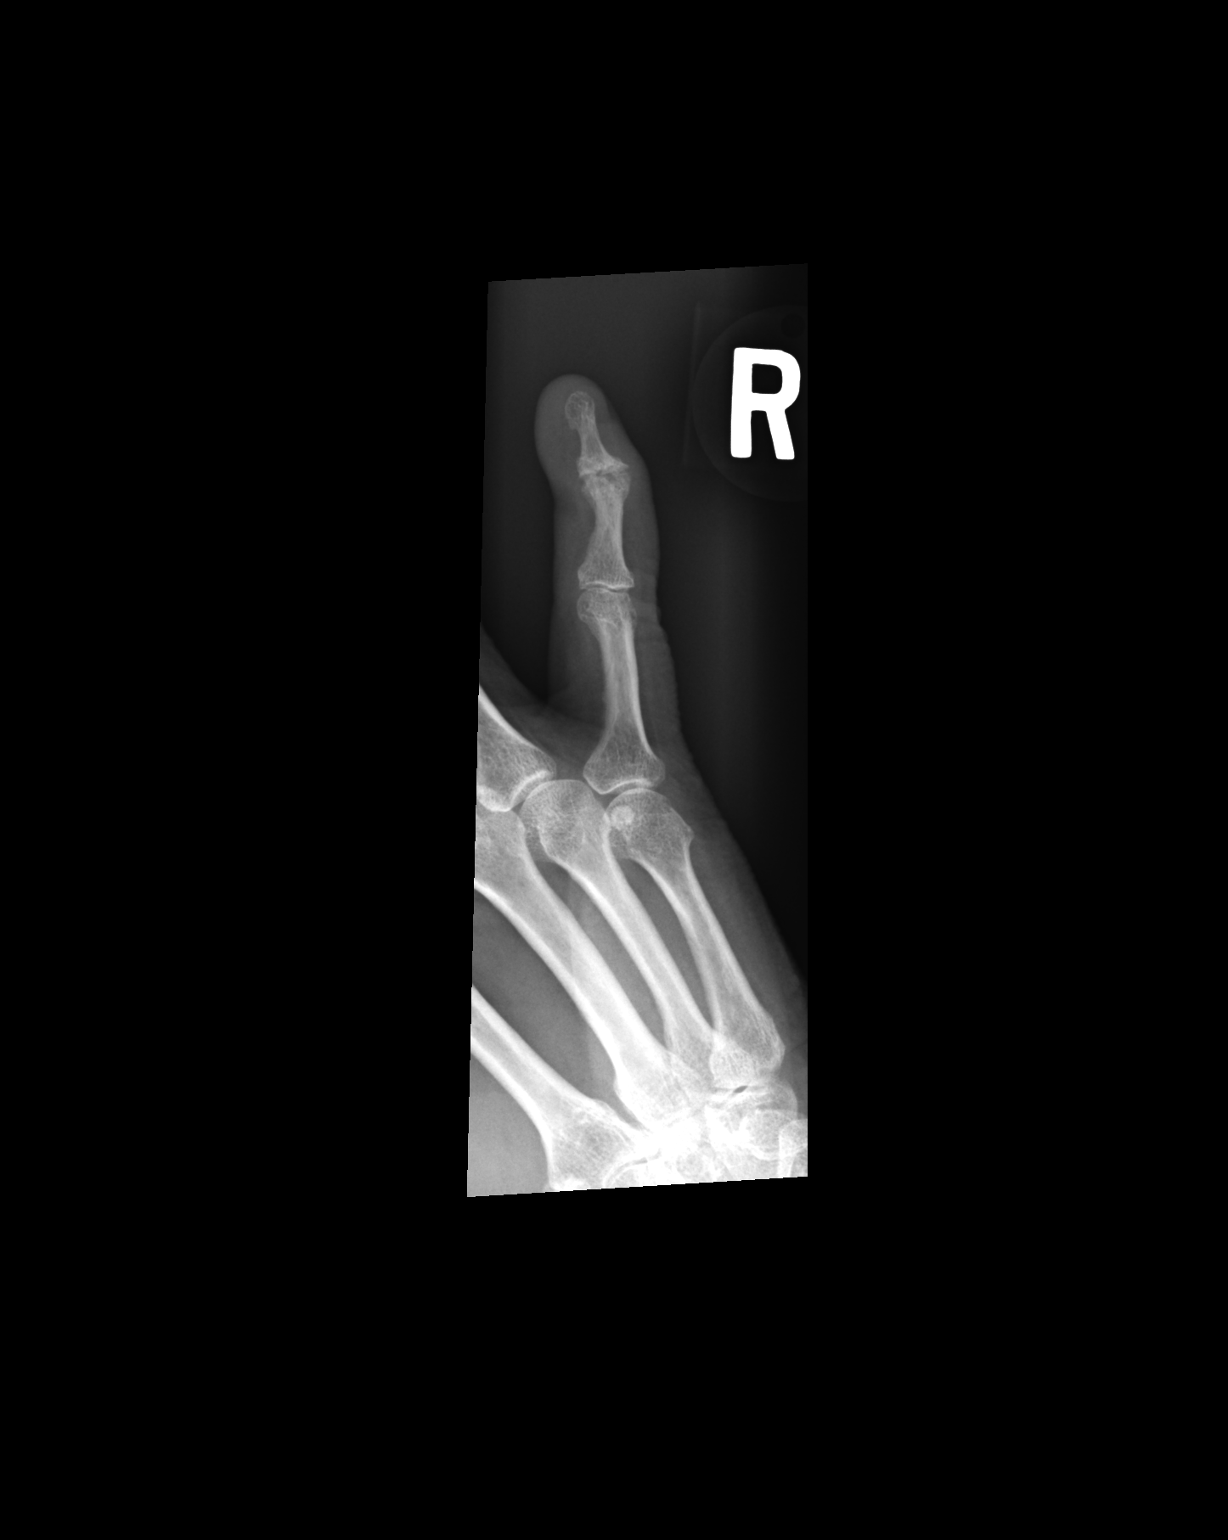

[dg finger little right (3 of 3)]
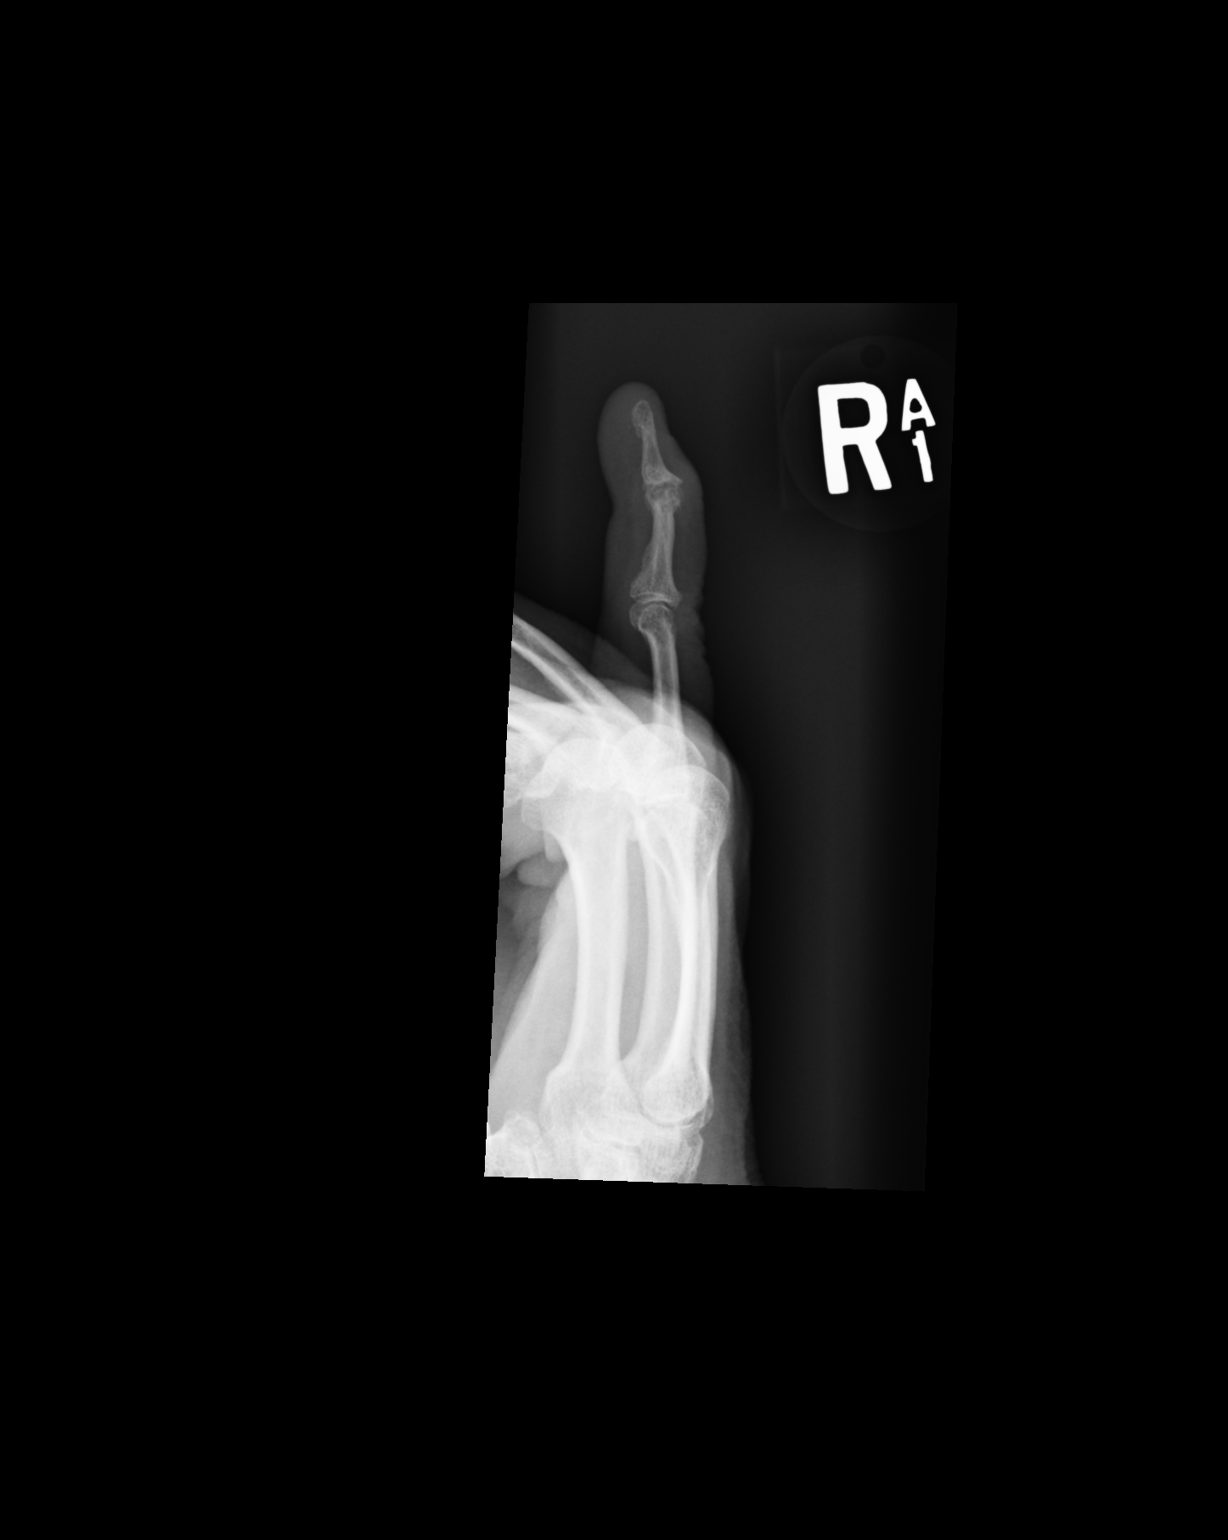

[3 of 3 positions shown; findings below may reference images not displayed]

FINDINGS: No acute fracture or malalignment. There is severe joint space
narrowing of the DIP joint, with marginal osteophytes and small
central erosion the middle phalanx head. Slight flexion deformity at
the DIP joint mild surrounding soft tissue swelling.
IMPRESSION: Severe joint space narrowing and central erosive changes at little
finger DIP joint, suspicious for erosive osteoarthritis.
